# Patient Record
Sex: Female | Born: 1965
Health system: Southern US, Community
[De-identification: ages and names within clinical notes are randomized; demographics above are authoritative.]

## PROBLEM LIST (undated history)

## (undated) DIAGNOSIS — N809 Endometriosis, unspecified: Secondary | ICD-10-CM

## (undated) DIAGNOSIS — E739 Lactose intolerance, unspecified: Secondary | ICD-10-CM

## (undated) DIAGNOSIS — R87619 Unspecified abnormal cytological findings in specimens from cervix uteri: Secondary | ICD-10-CM

## (undated) DIAGNOSIS — E785 Hyperlipidemia, unspecified: Secondary | ICD-10-CM

## (undated) HISTORY — DX: Hyperlipidemia, unspecified: E78.5

## (undated) HISTORY — DX: Unspecified abnormal cytological findings in specimens from cervix uteri: R87.619

## (undated) HISTORY — DX: Lactose intolerance, unspecified: E73.9

## (undated) HISTORY — DX: Endometriosis, unspecified: N80.9

---

## 1992-08-13 HISTORY — PX: REFRACTIVE SURGERY: SHX103

## 2002-08-13 HISTORY — PX: ABDOMINAL HYSTERECTOMY: SHX81

## 2005-03-02 ENCOUNTER — Emergency Department: Payer: Self-pay | Admitting: Emergency Medicine

## 2005-05-22 ENCOUNTER — Ambulatory Visit: Payer: Self-pay | Admitting: Obstetrics and Gynecology

## 2005-07-03 ENCOUNTER — Ambulatory Visit: Payer: Self-pay | Admitting: Obstetrics and Gynecology

## 2006-05-28 ENCOUNTER — Ambulatory Visit: Payer: Self-pay | Admitting: Obstetrics and Gynecology

## 2006-08-13 HISTORY — PX: REDUCTION MAMMAPLASTY: SUR839

## 2007-06-03 ENCOUNTER — Ambulatory Visit: Payer: Self-pay | Admitting: Obstetrics and Gynecology

## 2007-12-18 ENCOUNTER — Emergency Department: Payer: Self-pay | Admitting: Emergency Medicine

## 2008-06-08 ENCOUNTER — Ambulatory Visit: Payer: Self-pay | Admitting: Obstetrics and Gynecology

## 2010-07-18 ENCOUNTER — Ambulatory Visit: Payer: Self-pay | Admitting: Family Medicine

## 2011-09-13 ENCOUNTER — Ambulatory Visit: Payer: Self-pay | Admitting: Family Medicine

## 2012-08-14 ENCOUNTER — Ambulatory Visit: Payer: Self-pay | Admitting: Family Medicine

## 2013-08-27 ENCOUNTER — Ambulatory Visit: Payer: Self-pay | Admitting: Family Medicine

## 2015-11-28 ENCOUNTER — Encounter: Payer: Self-pay | Admitting: *Deleted

## 2015-12-14 ENCOUNTER — Encounter: Payer: Self-pay | Admitting: General Surgery

## 2015-12-14 ENCOUNTER — Ambulatory Visit (INDEPENDENT_AMBULATORY_CARE_PROVIDER_SITE_OTHER): Payer: Managed Care, Other (non HMO) | Admitting: General Surgery

## 2015-12-14 VITALS — BP 132/64 | HR 76 | Resp 12 | Ht 66.0 in | Wt 160.0 lb

## 2015-12-14 DIAGNOSIS — Z1211 Encounter for screening for malignant neoplasm of colon: Secondary | ICD-10-CM

## 2015-12-14 MED ORDER — POLYETHYLENE GLYCOL 3350 17 GM/SCOOP PO POWD: ORAL | Status: DC

## 2015-12-14 NOTE — Patient Instructions (Addendum)
Colonoscopy A colonoscopy is an exam to look at the entire large intestine (colon). This exam can help find problems such as tumors, polyps, inflammation, and areas of bleeding. The exam takes about 1 hour.  LET YOUR HEALTH CARE PROVIDER KNOW ABOUT:   Any allergies you have.  All medicines you are taking, including vitamins, herbs, eye drops, creams, and over-the-counter medicines.  Previous problems you or members of your family have had with the use of anesthetics.  Any blood disorders you have.  Previous surgeries you have had.  Medical conditions you have. RISKS AND COMPLICATIONS  Generally, this is a safe procedure. However, as with any procedure, complications can occur. Possible complications include:  Bleeding.  Tearing or rupture of the colon wall.  Reaction to medicines given during the exam.  Infection (rare). BEFORE THE PROCEDURE   Ask your health care provider about changing or stopping your regular medicines.  You may be prescribed an oral bowel prep. This involves drinking a large amount of medicated liquid, starting the day before your procedure. The liquid will cause you to have multiple loose stools until your stool is almost clear or light green. This cleans out your colon in preparation for the procedure.  Do not eat or drink anything else once you have started the bowel prep, unless your health care provider tells you it is safe to do so.  Arrange for someone to drive you home after the procedure. PROCEDURE   You will be given medicine to help you relax (sedative).  You will lie on your side with your knees bent.  A long, flexible tube with a light and camera on the end (colonoscope) will be inserted through the rectum and into the colon. The camera sends video back to a computer screen as it moves through the colon. The colonoscope also releases carbon dioxide gas to inflate the colon. This helps your health care provider see the area better.  During  the exam, your health care provider may take a small tissue sample (biopsy) to be examined under a microscope if any abnormalities are found.  The exam is finished when the entire colon has been viewed. AFTER THE PROCEDURE   Do not drive for 24 hours after the exam.  You may have a small amount of blood in your stool.  You may pass moderate amounts of gas and have mild abdominal cramping or bloating. This is caused by the gas used to inflate your colon during the exam.  Ask when your test results will be ready and how you will get your results. Make sure you get your test results.   This information is not intended to replace advice given to you by your health care provider. Make sure you discuss any questions you have with your health care provider.   Document Released: 07/27/2000 Document Revised: 05/20/2013 Document Reviewed: 04/06/2013 Elsevier Interactive Patient Education 2016 [employer]  Patient has been scheduled for a colonoscopy on 01-18-16 at [facility]. This patient has been asked to discontinue fish oil one week prior to procedure. 

## 2015-12-14 NOTE — Progress Notes (Signed)
Patient ID: Jobe Igo, female   DOB: 21-Jun-1966, 50 y.o.   MRN: [id number]  Chief Complaint  Patient presents with  . Other    Colonoscopy    HPI Megan Hayes is a 50 y.o. female here today for an evaluation for a colonoscopy. None prior. Patient reports she has occasional diarrhea and some dark color stools. She states she has some lower left abdominal pain before she moves her bowels.  I have reviewed the history of present illness with the patient.  HPI  History reviewed. No pertinent past medical history.  Past Surgical History  Procedure Laterality Date  . Eye surgery  1994  . Partial hysterectomy  2004  . Breast surgery  2009    breast reduction    Family History  Problem Relation Age of Onset  . Cancer Maternal Aunt 65    Colon  . Cancer Maternal Uncle     Liver  . Cancer Maternal Grandmother     Ovarian Cancer  . Cancer Maternal Aunt     Colon    Social History Social History  Substance Use Topics  . Smoking status: Never Smoker   . Smokeless tobacco: Never Used  . Alcohol Use: 0.6 - 1.2 oz/week    1-2 Standard drinks or equivalent per week    No Known Allergies  Current Outpatient Prescriptions  Medication Sig Dispense Refill  . cholecalciferol (VITAMIN D) 1000 units tablet Take 1,000 Units by mouth daily.    Marland Kitchen ibuprofen (ADVIL,MOTRIN) 200 MG tablet Take 200 mg by mouth every 6 (six) hours as needed.    . Omega-3 Fatty Acids (FISH OIL) 1000 MG CAPS Take by mouth.     No current facility-administered medications for this visit.    Review of Systems Review of Systems  Constitutional: Negative.   Respiratory: Negative.   Cardiovascular: Negative.     Blood pressure 132/64, pulse 76, resp. rate 12, height  (1.676 m), weight 160 lb (72.576 kg).  Physical Exam Physical Exam  Constitutional: She is oriented to person, place, and time. Vital signs are normal. She appears well-developed and well-nourished.  Eyes:  Conjunctivae are normal. No scleral icterus.  Neck: Neck supple. No thyromegaly present.  Cardiovascular: Normal rate, regular rhythm and normal heart sounds.   Pulmonary/Chest: Effort normal and breath sounds normal.  Abdominal: Soft. Normal appearance. She exhibits no distension. There is no tenderness.  Lymphadenopathy:    She has no cervical adenopathy.  Neurological: She is alert and oriented to person, place, and time.  Skin: Skin is warm and dry.    Data Reviewed Prior notes  Assessment    Stable exam. Recommend screening colonoscopy based on age. Remote family history of colon cancer.    Plan    Colonoscopy with possible biopsy/polypectomy prn: Information regarding the procedure, including its potential risks and complications (including but not limited to perforation of the bowel, which may require emergency surgery to repair, and bleeding) was verbally given to the patient. Educational information regarding lower intestinal endoscopy was given to the patient. Written instructions for how to complete the bowel prep using Miralax were provided. The importance of drinking ample fluids to avoid dehydration as a result of the prep emphasized.    Patient has been scheduled for a colonoscopy on 01-18-16 at [facility]. This patient has been asked to discontinue fish oil one week prior to procedure.   This information has been scribed by Milas Kocher, CMA  PCP:No PCP Ref: Dr. Rayvon Charosenow   [name] G 12/14/2015, 3:04 PM

## 2016-01-18 ENCOUNTER — Ambulatory Visit: Payer: Managed Care, Other (non HMO) | Admitting: Anesthesiology

## 2016-01-18 ENCOUNTER — Ambulatory Visit
Admission: RE | Admit: 2016-01-18 | Discharge: 2016-01-18 | Disposition: A | Discharge status: Home Health | Payer: Managed Care, Other (non HMO) | Source: Ambulatory Visit | Attending: General Surgery | Admitting: General Surgery

## 2016-01-18 ENCOUNTER — Encounter
Admission: RE | Disposition: A | Discharge status: Home Health | Payer: Self-pay | Source: Ambulatory Visit | Attending: General Surgery

## 2016-01-18 DIAGNOSIS — Z1211 Encounter for screening for malignant neoplasm of colon: Secondary | ICD-10-CM | POA: Insufficient documentation

## 2016-01-18 DIAGNOSIS — Z8041 Family history of malignant neoplasm of ovary: Secondary | ICD-10-CM | POA: Diagnosis not present

## 2016-01-18 DIAGNOSIS — Z800 Family history of malignant neoplasm of digestive organs: Secondary | ICD-10-CM | POA: Insufficient documentation

## 2016-01-18 DIAGNOSIS — Z90710 Acquired absence of both cervix and uterus: Secondary | ICD-10-CM | POA: Insufficient documentation

## 2016-01-18 HISTORY — PX: COLONOSCOPY WITH PROPOFOL: SHX5780

## 2016-01-18 SURGERY — COLONOSCOPY WITH PROPOFOL
Anesthesia: General

## 2016-01-18 MED ORDER — MIDAZOLAM HCL 5 MG/5ML IJ SOLN
INTRAMUSCULAR | Status: DC | PRN
  Administered 2016-01-18: 1 mg via INTRAVENOUS

## 2016-01-18 MED ORDER — PROPOFOL 500 MG/50ML IV EMUL
INTRAVENOUS | Status: DC | PRN
  Administered 2016-01-18: 180 ug/kg/min via INTRAVENOUS

## 2016-01-18 MED ORDER — LIDOCAINE 2% (20 MG/ML) 5 ML SYRINGE
INTRAMUSCULAR | Status: DC | PRN
  Administered 2016-01-18: 40 mg via INTRAVENOUS

## 2016-01-18 MED ORDER — PHENYLEPHRINE HCL 10 MG/ML IJ SOLN
INTRAMUSCULAR | Status: DC | PRN
  Administered 2016-01-18 (×3): 100 ug via INTRAVENOUS

## 2016-01-18 MED ORDER — PROPOFOL 10 MG/ML IV BOLUS
INTRAVENOUS | Status: DC | PRN
  Administered 2016-01-18 (×2): 50 mg via INTRAVENOUS

## 2016-01-18 MED ORDER — FENTANYL CITRATE (PF) 100 MCG/2ML IJ SOLN
INTRAMUSCULAR | Status: DC | PRN
  Administered 2016-01-18: 50 ug via INTRAVENOUS

## 2016-01-18 MED ORDER — SODIUM CHLORIDE 0.9 % IV SOLN
INTRAVENOUS | Status: DC
  Administered 2016-01-18: 1000 mL via INTRAVENOUS
  Administered 2016-01-18: 11:00:00 via INTRAVENOUS

## 2016-01-18 NOTE — Anesthesia Preprocedure Evaluation (Signed)
Anesthesia Evaluation  Patient identified by MRN, date of birth, ID band Patient awake    Reviewed: Allergy & Precautions, H&P , NPO status , Patient's Chart, lab work & pertinent test results, reviewed documented beta blocker date and time   Airway Mallampati: I  TM Distance: >3 FB Neck ROM: full    Dental  (+) Caps, Teeth Intact   Pulmonary neg pulmonary ROS,    Pulmonary exam normal breath sounds clear to auscultation       Cardiovascular Exercise Tolerance: Good negative cardio ROS Normal cardiovascular exam Rhythm:regular Rate:Normal     Neuro/Psych negative neurological ROS  negative psych ROS   GI/Hepatic negative GI ROS, Neg liver ROS,   Endo/Other  negative endocrine ROS  Renal/GU negative Renal ROS  negative genitourinary   Musculoskeletal   Abdominal   Peds  Hematology negative hematology ROS (+)   Anesthesia Other Findings History reviewed. No pertinent past medical history.   Reproductive/Obstetrics negative OB ROS                             Anesthesia Physical Anesthesia Plan  ASA: I  Anesthesia Plan: General   Post-op Pain Management:    Induction:   Airway Management Planned:   Additional Equipment:   Intra-op Plan:   Post-operative Plan:   Informed Consent: I have reviewed the patients History and Physical, chart, labs and discussed the procedure including the risks, benefits and alternatives for the proposed anesthesia with the patient or authorized representative who has indicated his/her understanding and acceptance.   Dental Advisory Given  Plan Discussed with: Anesthesiologist, CRNA and Surgeon  Anesthesia Plan Comments:         Anesthesia Quick Evaluation

## 2016-01-18 NOTE — H&P (Signed)
Megan Hayes is an 50 y.o. female.   Chief Complaint: here for colonoscopy HPI: Healthy 50 yr old female with no major GI symptoms. Needs screening colonoscopy.  History reviewed. No pertinent past medical history.  Past Surgical History  Procedure Laterality Date  . Eye surgery  1994  . Partial hysterectomy  2004  . Breast surgery  2009    breast reduction    Family History  Problem Relation Age of Onset  . Cancer Maternal Aunt 65    Colon  . Cancer Maternal Uncle     Liver  . Cancer Maternal Grandmother     Ovarian Cancer  . Cancer Maternal Aunt     Colon   Social History:  reports that she has never smoked. She has never used smokeless tobacco. She reports that she drinks about 0.6 - 1.2 oz of alcohol per week. She reports that she does not use illicit drugs.  Allergies: No Known Allergies  Medications Prior to Admission  Medication Sig Dispense Refill  . cholecalciferol (VITAMIN D) 1000 units tablet Take 1,000 Units by mouth daily.    Marland Kitchen. ibuprofen (ADVIL,MOTRIN) 200 MG tablet Take 200 mg by mouth every 6 (six) hours as needed.    . Omega-3 Fatty Acids (FISH OIL) 1000 MG CAPS Take by mouth.    . polyethylene glycol powder (GLYCOLAX/MIRALAX) powder 255 grams one bottle for colonoscopy prep 255 g 0    No results found for this or any previous visit (from the past 48 hour(s)). No results found.  Review of Systems  Constitutional: Negative.   Respiratory: Negative.   Cardiovascular: Negative.   Gastrointestinal: Positive for abdominal pain (occasional, llq). Negative for nausea, vomiting, diarrhea and constipation.  Genitourinary: Negative.     Blood pressure 110/79, pulse 78, temperature 98.5 F (36.9 C), temperature source Oral, resp. rate 17, height 5\' 6"  (1.676 m), weight 160 lb (72.576 kg), SpO2 99 %. Physical Exam  Constitutional: She is oriented to person, place, and time. She appears well-developed and well-nourished.  Eyes: Conjunctivae are  normal. No scleral icterus.  Neck: Neck supple.  Cardiovascular: Normal rate, regular rhythm and normal heart sounds.   Respiratory: Effort normal and breath sounds normal.  GI: Soft. Bowel sounds are normal. She exhibits no distension and no mass. There is no tenderness.  Lymphadenopathy:    She has no cervical adenopathy.  Neurological: She is alert and oriented to person, place, and time.  Skin: Skin is warm and dry.     Assessment/Plan Proceed with colonoscopy as scheduled  Kieth BrightlySANKAR,Advay Volante G, MD 01/18/2016, 11:34 AM

## 2016-01-18 NOTE — Op Note (Signed)
[facility] Patient Name: Megan Hayes Procedure Date: 01/18/2016 11:41 AM MRN: [id number] Account #: [id number] Date of Birth: November 23, 1965 Admit Type: Outpatient Age: 50 Room: [facility] ENDO ROOM 1 Gender: Female Note Status: Finalized Procedure:            Colonoscopy Indications:          Screening for colorectal malignant neoplasm Providers:            Adana Marik G. Evette Cristal, MD Referring MD:         Sallye Lat Md, MD (Referring MD) Medicines:            General Anesthesia Complications:        No immediate complications. Procedure:            Pre-Anesthesia Assessment:                       - General anesthesia under the supervision of an                        anesthesiologist was determined to be medically                        necessary for this procedure based on review of the                        patient's medical history, medications, and prior                        anesthesia history.                       After obtaining informed consent, the colonoscope was                        passed under direct vision. Throughout the procedure,                        the patient's blood pressure, pulse, and oxygen                        saturations were monitored continuously. The                        Colonoscope was introduced through the anus and                        advanced to the the cecum, identified by the ileocecal                        valve. The patient tolerated the procedure well. The                        quality of the bowel preparation was good. Findings:      The perianal and digital rectal examinations were normal.      The entire examined colon appeared normal on direct and retroflexion       views. Impression:           - The entire examined colon is normal on direct and  retroflexion views.                       - No specimens collected. Recommendation:       - Discharge patient to  home. Procedure Code(s):    --- Professional ---                       [id number], Colonoscopy, flexible; diagnostic, including                        collection of specimen(s) by brushing or washing, when                        performed (separate procedure) Diagnosis Code(s):    --- Professional ---                       Z12.11, Encounter for screening for malignant neoplasm                        of colon CPT copyright 2016 American [employer]. All rights reserved. The codes documented in this report are preliminary and upon coder review may  be revised to meet current compliance requirements. Kieth BrightlySeeplaputhur G Aya Geisel, MD 01/18/2016 12:23:45 PM This report has been signed electronically. Number of Addenda: 0 Note Initiated On: 01/18/2016 11:41 AM Scope Withdrawal Time: 0 hours 8 minutes 19 seconds  Total Procedure Duration: 0 hours 24 minutes 34 seconds       [facility]

## 2016-01-18 NOTE — Transfer of Care (Signed)
Immediate Anesthesia Transfer of Care Note  Patient: Megan Hayes  Procedure(s) Performed: Procedure(s): COLONOSCOPY WITH PROPOFOL (N/A)  Patient Location: PACU and Endoscopy Unit  Anesthesia Type:General  Level of Consciousness: awake and patient cooperative  Airway & Oxygen Therapy: Patient Spontanous Breathing and Patient connected to nasal cannula oxygen  Post-op Assessment: Report given to RN and Post -op Vital signs reviewed and stable  Post vital signs: Reviewed and stable  Last Vitals:  Filed Vitals:   01/18/16 1046  BP: 110/79  Pulse: 78  Temp: 36.9 C  Resp: 17    Last Pain: There were no vitals filed for this visit.       Complications: No apparent anesthesia complications

## 2016-01-18 NOTE — Anesthesia Postprocedure Evaluation (Signed)
Anesthesia Post Note  Patient: Megan Hayes  Procedure(s) Performed: Procedure(s) (LRB): COLONOSCOPY WITH PROPOFOL (N/A)  Patient location during evaluation: Endoscopy Anesthesia Type: General Level of consciousness: awake and alert Pain management: pain level controlled Vital Signs Assessment: post-procedure vital signs reviewed and stable Respiratory status: spontaneous breathing, nonlabored ventilation, respiratory function stable and patient connected to nasal cannula oxygen Cardiovascular status: blood pressure returned to baseline and stable Postop Assessment: no signs of nausea or vomiting Anesthetic complications: no    Last Vitals:  Filed Vitals:   01/18/16 1248 01/18/16 1258  BP: 115/78 112/95  Pulse: 68 70  Temp:    Resp: 19 17    Last Pain: There were no vitals filed for this visit.               Lenard SimmerAndrew Woodford Strege

## 2016-01-19 ENCOUNTER — Encounter: Payer: Self-pay | Admitting: General Surgery

## 2016-05-13 HISTORY — PX: TENOSYNOVECTOMY: SHX6110

## 2016-08-14 ENCOUNTER — Encounter (HOSPITAL_COMMUNITY): Payer: Self-pay | Admitting: *Deleted

## 2016-08-15 NOTE — H&P (Signed)
  Shadara L Tabares Ross LudwigDe Cardenas is an 51 y.o. female.   Chief Complaint: RIGHT WRIST RADIAL STYLOID TENOSYNOVITIS HPI: PATIENT IS BEING FOLLOWED FOR RIGHT WRIST RADIAL STYLOID TENOSYNOVITIS.  PREVIOUS SURGERY FOR LEFT WRIST RADIAL STYLOID TENOSYNOVITIS ON 05/21/16.  PATIENT HAS FAILED CONSERVATIVE MANAGEMENT FOR THE RIGHT WRIST AND WOULD LIKE TO PROCEED WITH SURGERY.  THE PATIENT IS HERE TODAY FOR SURGERY.  No past medical history on file.  Past Surgical History:  Procedure Laterality Date  . BREAST SURGERY  2009   breast reduction  . COLONOSCOPY WITH PROPOFOL N/A 01/18/2016   Procedure: COLONOSCOPY WITH PROPOFOL;  Surgeon: Kieth BrightlySeeplaputhur G Sankar, MD;  Location: [facility] ENDOSCOPY;  Service: Endoscopy;  Laterality: N/A;  . EYE SURGERY  1994  . PARTIAL HYSTERECTOMY  2004    Family History  Problem Relation Age of Onset  . Cancer Maternal Aunt 65    Colon  . Cancer Maternal Uncle     Liver  . Cancer Maternal Grandmother     Ovarian Cancer  . Cancer Maternal Aunt     Colon   Social History:  reports that she has never smoked. She has never used smokeless tobacco. She reports that she drinks about 0.6 - 1.2 oz of alcohol per week . She reports that she does not use drugs.  Allergies: No Known Allergies  No prescriptions prior to admission.    No results found for this or any previous visit (from the past 48 hour(s)). No results found.  ROS NO RECENT ILLNESSES OR HOSPITALIZATIONS  There were no vitals taken for this visit. Physical Exam  General Appearance:  Alert, cooperative, no distress, appears stated age  Head:  Normocephalic, without obvious abnormality, atraumatic  Eyes:  Pupils equal, conjunctiva/corneas clear,         Throat: Lips, mucosa, and tongue normal; teeth and gums normal  Neck: No visible masses     Lungs:   respirations unlabored  Chest Wall:  No tenderness or deformity  Heart:  Regular rate and rhythm,  Abdomen:   Soft, non-tender,         Extremities:  RIGHT UE: TENDER TO PALPATION ALONG THE FIRST DORSAL COMPARTMENT. POSITIVE FINKELSTEIN'S TEST. NEUROMOTOR AND SENSORY FUNCTION IS NORMAL TO THE WRIST, HAND, AND DIGITS  Pulses: 2+ and symmetric  Skin: Skin color, texture, turgor normal, no rashes or lesions     Neurologic: Normal    Assessment RIGHT WRIST RADIAL STYLOID TENOSYNOVITIS  Plan RIGHT WRIST FIRST DORSAL COMPARTMENT TENOSYNOVECTOMY  R/B/A DISCUSSED WITH PT IN OFFICE.  PT VOICED UNDERSTANDING OF PLAN CONSENT SIGNED DAY OF SURGERY PT SEEN AND EXAMINED PRIOR TO OPERATIVE PROCEDURE/DAY OF SURGERY SITE MARKED. QUESTIONS ANSWERED WILL GO HOME FOLLOWING SURGERY  WE ARE PLANNING SURGERY FOR YOUR UPPER EXTREMITY. THE RISKS AND BENEFITS OF SURGERY INCLUDE BUT NOT LIMITED TO BLEEDING INFECTION, DAMAGE TO NEARBY NERVES ARTERIES TENDONS, FAILURE OF SURGERY TO ACCOMPLISH ITS INTENDED GOALS, PERSISTENT SYMPTOMS AND NEED FOR FURTHER SURGICAL INTERVENTION. WITH THIS IN MIND WE WILL PROCEED. I HAVE DISCUSSED WITH THE PATIENT THE PRE AND POSTOPERATIVE REGIMEN AND THE DOS AND DON'TS. PT VOICED UNDERSTANDING AND INFORMED CONSENT SIGNED.  Karma GreaserSamantha Bonham Barton 08/15/2016, 1:25 PM

## 2016-08-18 ENCOUNTER — Ambulatory Visit (HOSPITAL_COMMUNITY): Payer: Worker's Compensation | Admitting: Anesthesiology

## 2016-08-18 ENCOUNTER — Encounter (HOSPITAL_COMMUNITY)
Admission: RE | Disposition: A | Discharge status: Home / Self Care | Payer: Self-pay | Source: Ambulatory Visit | Attending: Orthopedic Surgery

## 2016-08-18 ENCOUNTER — Ambulatory Visit (HOSPITAL_COMMUNITY)
Admission: RE | Admit: 2016-08-18 | Discharge: 2016-08-18 | Disposition: A | Discharge status: Home / Self Care | Payer: Worker's Compensation | Source: Ambulatory Visit | Attending: Orthopedic Surgery | Admitting: Orthopedic Surgery

## 2016-08-18 ENCOUNTER — Encounter (HOSPITAL_COMMUNITY): Payer: Self-pay | Admitting: *Deleted

## 2016-08-18 ENCOUNTER — Ambulatory Visit: Admit: 2016-08-18 | Payer: Managed Care, Other (non HMO) | Admitting: Orthopedic Surgery

## 2016-08-18 DIAGNOSIS — M654 Radial styloid tenosynovitis [de Quervain]: Secondary | ICD-10-CM | POA: Diagnosis not present

## 2016-08-18 HISTORY — PX: TENOSYNOVECTOMY: SHX6110

## 2016-08-18 SURGERY — TENOSYNOVECTOMY
Anesthesia: Monitor Anesthesia Care | Site: Wrist | Laterality: Right

## 2016-08-18 SURGERY — TENOSYNOVECTOMY
Anesthesia: Monitor Anesthesia Care | Laterality: Right

## 2016-08-18 MED ORDER — ONDANSETRON HCL 4 MG/2ML IJ SOLN
INTRAMUSCULAR | Status: AC
  Filled 2016-08-18: qty 2

## 2016-08-18 MED ORDER — BUPIVACAINE HCL (PF) 0.5 % IJ SOLN
INTRAMUSCULAR | Status: AC
  Filled 2016-08-18: qty 30

## 2016-08-18 MED ORDER — CEFAZOLIN SODIUM-DEXTROSE 2-4 GM/100ML-% IV SOLN
INTRAVENOUS | Status: AC
  Filled 2016-08-18: qty 100

## 2016-08-18 MED ORDER — LACTATED RINGERS IV SOLN: INTRAVENOUS | Status: DC

## 2016-08-18 MED ORDER — LIDOCAINE HCL (PF) 1 % IJ SOLN
INTRAMUSCULAR | Status: AC
  Filled 2016-08-18: qty 30

## 2016-08-18 MED ORDER — CEFAZOLIN SODIUM-DEXTROSE 2-4 GM/100ML-% IV SOLN
2.0000 g | INTRAVENOUS | Status: AC
  Administered 2016-08-18: 2 g via INTRAVENOUS

## 2016-08-18 MED ORDER — PROPOFOL 10 MG/ML IV BOLUS
INTRAVENOUS | Status: DC | PRN
  Administered 2016-08-18: 10 mg via INTRAVENOUS
  Administered 2016-08-18: 20 mg via INTRAVENOUS
  Administered 2016-08-18 (×3): 10 mg via INTRAVENOUS
  Administered 2016-08-18: 20 mg via INTRAVENOUS
  Administered 2016-08-18 (×5): 10 mg via INTRAVENOUS
  Administered 2016-08-18: 20 mg via INTRAVENOUS
  Administered 2016-08-18: 10 mg via INTRAVENOUS

## 2016-08-18 MED ORDER — ONDANSETRON HCL 4 MG/2ML IJ SOLN
INTRAMUSCULAR | Status: DC | PRN
  Administered 2016-08-18: 4 mg via INTRAVENOUS

## 2016-08-18 MED ORDER — LIDOCAINE HCL 1 % IJ SOLN
INTRAMUSCULAR | Status: DC | PRN
  Administered 2016-08-18: 15 mL

## 2016-08-18 MED ORDER — LIDOCAINE 2% (20 MG/ML) 5 ML SYRINGE
INTRAMUSCULAR | Status: AC
  Filled 2016-08-18: qty 5

## 2016-08-18 MED ORDER — BUPIVACAINE HCL (PF) 0.25 % IJ SOLN
INTRAMUSCULAR | Status: AC
  Filled 2016-08-18: qty 30

## 2016-08-18 MED ORDER — HYDROMORPHONE HCL 1 MG/ML IJ SOLN
0.2500 mg | INTRAMUSCULAR | Status: DC | PRN
  Administered 2016-08-18: 0.5 mg via INTRAVENOUS

## 2016-08-18 MED ORDER — PROPOFOL 10 MG/ML IV BOLUS
INTRAVENOUS | Status: AC
  Filled 2016-08-18: qty 20

## 2016-08-18 MED ORDER — HYDROMORPHONE HCL 1 MG/ML IJ SOLN
INTRAMUSCULAR | Status: AC
  Filled 2016-08-18: qty 0.5

## 2016-08-18 MED ORDER — LIDOCAINE 2% (20 MG/ML) 5 ML SYRINGE
INTRAMUSCULAR | Status: DC | PRN
  Administered 2016-08-18: 40 mg via INTRAVENOUS

## 2016-08-18 MED ORDER — LACTATED RINGERS IV SOLN
INTRAVENOUS | Status: DC | PRN
  Administered 2016-08-18: 11:00:00 via INTRAVENOUS

## 2016-08-18 MED ORDER — 0.9 % SODIUM CHLORIDE (POUR BTL) OPTIME
TOPICAL | Status: DC | PRN
  Administered 2016-08-18: 1000 mL

## 2016-08-18 MED ORDER — CHLORHEXIDINE GLUCONATE 4 % EX LIQD: 60.0000 mL | Freq: Once | CUTANEOUS | Status: DC

## 2016-08-18 MED ORDER — BUPIVACAINE HCL (PF) 0.5 % IJ SOLN
INTRAMUSCULAR | Status: DC | PRN
  Administered 2016-08-18: 15 mL

## 2016-08-18 SURGICAL SUPPLY — 10 items
BANDAGE ACE 3X5.8 VEL STRL LF (GAUZE/BANDAGES/DRESSINGS) ×2 IMPLANT
BNDG ELASTIC 2X5.8 VLCR STR LF (GAUZE/BANDAGES/DRESSINGS) ×2 IMPLANT
CUFF TOURNIQUET SINGLE 18IN (TOURNIQUET CUFF) ×2 IMPLANT
GAUZE SPONGE 4X4 12PLY STRL (GAUZE/BANDAGES/DRESSINGS) ×2 IMPLANT
PACK DISP ORTHO I MAJOR (PACKS) ×2 IMPLANT
PADDING CAST ABS 3INX4YD NS (CAST SUPPLIES) ×1
PADDING CAST ABS COTTON 3X4 (CAST SUPPLIES) ×1 IMPLANT
STRIP CLOSURE SKIN 1/2X4 (GAUZE/BANDAGES/DRESSINGS) ×2 IMPLANT
SUT MNCRL AB 4-0 PS2 18 (SUTURE) ×2 IMPLANT
SUT PROLENE 4 0 PS 2 18 (SUTURE) ×2 IMPLANT

## 2016-08-18 NOTE — Anesthesia Preprocedure Evaluation (Addendum)
Anesthesia Evaluation  Patient identified by MRN, date of birth, ID band Patient awake    Reviewed: Allergy & Precautions, H&P , NPO status , Patient's Chart, lab work & pertinent test results  Airway Mallampati: II  TM Distance: >3 FB Neck ROM: Full    Dental no notable dental hx. (+) Teeth Intact, Dental Advisory Given   Pulmonary neg pulmonary ROS,    Pulmonary exam normal breath sounds clear to auscultation       Cardiovascular negative cardio ROS   Rhythm:Regular Rate:Normal     Neuro/Psych negative neurological ROS  negative psych ROS   GI/Hepatic negative GI ROS, Neg liver ROS,   Endo/Other  negative endocrine ROS  Renal/GU negative Renal ROS  negative genitourinary   Musculoskeletal   Abdominal   Peds  Hematology negative hematology ROS (+)   Anesthesia Other Findings   Reproductive/Obstetrics negative OB ROS                            Anesthesia Physical Anesthesia Plan  ASA: I  Anesthesia Plan: MAC   Post-op Pain Management:    Induction: Intravenous  Airway Management Planned: Simple Face Mask  Additional Equipment:   Intra-op Plan:   Post-operative Plan:   Informed Consent: I have reviewed the patients History and Physical, chart, labs and discussed the procedure including the risks, benefits and alternatives for the proposed anesthesia with the patient or authorized representative who has indicated his/her understanding and acceptance.   Dental advisory given  Plan Discussed with: CRNA  Anesthesia Plan Comments:         Anesthesia Quick Evaluation

## 2016-08-18 NOTE — Discharge Instructions (Signed)
KEEP BANDAGE CLEAN AND DRY °CALL OFFICE FOR F/U APPT [phone] °DR Zared Knoth CELL [phone] °KEEP HAND ELEVATED ABOVE HEART °OK TO APPLY ICE TO OPERATIVE AREA °CONTACT OFFICE IF ANY WORSENING PAIN OR CONCERNS. °

## 2016-08-18 NOTE — Anesthesia Postprocedure Evaluation (Signed)
Anesthesia Post Note  Patient: Megan Hayes  Procedure(s) Performed: Procedure(s) (LRB): TENOSYNOVECTOMY (Right)  Patient location during evaluation: PACU Anesthesia Type: MAC Level of consciousness: awake and alert Pain management: pain level controlled Vital Signs Assessment: post-procedure vital signs reviewed and stable Respiratory status: spontaneous breathing, nonlabored ventilation and respiratory function stable Cardiovascular status: stable and blood pressure returned to baseline Anesthetic complications: no       Last Vitals:  Vitals:   08/18/16 1140 08/18/16 1156  BP: 127/75 119/81  Pulse: 65 70  Resp: 12 12  Temp:      Last Pain:  Vitals:   08/18/16 1140  TempSrc:   PainSc: 6                  Kayanna Mckillop,W. EDMOND

## 2016-08-18 NOTE — Transfer of Care (Signed)
Immediate Anesthesia Transfer of Care Note  Patient: Megan Hayes  Procedure(s) Performed: Procedure(s): TENOSYNOVECTOMY (Right)  Patient Location: PACU  Anesthesia Type:MAC  Level of Consciousness: awake  Airway & Oxygen Therapy: Patient Spontanous Breathing  Post-op Assessment: Report given to RN and Post -op Vital signs reviewed and stable  Post vital signs: Reviewed and stable  Last Vitals:  Vitals:   08/18/16 1126 08/18/16 1140  BP: 120/82 127/75  Pulse: 69 65  Resp: 12   Temp: (P) 36.6 C     Last Pain:  Vitals:   08/18/16 1140  TempSrc:   PainSc: (P) 6       Patients Stated Pain Goal: 3 (68/34/19 6222)  Complications: No apparent anesthesia complications

## 2016-08-19 ENCOUNTER — Encounter (HOSPITAL_COMMUNITY): Payer: Self-pay | Admitting: Orthopedic Surgery

## 2016-08-20 NOTE — Op Note (Signed)
NAME:  Megan Hayes, Megan Hayes    ACCOUNT NO.:  [id number]  MEDICAL RECORD NO.:  [id number]  LOCATION:                                FACILITY:  [facility]  PHYSICIAN:  Sharma CovertFred W. Argenis Kumari IV, M.D.DATE OF BIRTH:  [birth year]  DATE OF PROCEDURE:  08/18/2016 DATE OF DISCHARGE:  08/18/2016                              OPERATIVE REPORT   PREOPERATIVE DIAGNOSIS:  Right wrist 1st dorsal compartment tenosynovitis.  POSTOPERATIVE DIAGNOSIS:  Right wrist 1st dorsal compartment tenosynovitis.  ATTENDING THIS ADMISSION:  Sharma CovertFred W. Satoya Feeley, M.D., who scrubbed and prepped for the entire procedure.  ASSISTANT SURGEON:  None.  ANESTHESIA:  1% Xylocaine, 0.25% Marcaine local block with IV sedation.  SURGICAL PROCEDURE:  Right wrist first dorsal compartment tenosynovectomy.  SURGICAL INDICATIONS:  [name] is a right-hand dominant female, who has had persistent right and left de Quervain's tenosynovitis.  The patient had done well on the left side and elected to undergo the similar procedure on the right.  Risks of surgery include but not limited to bleeding, infection, damage to nearby nerves, arteries, or tendons; loss of motion of wrist and digits, incomplete relief of symptoms, and need for further surgical intervention.  DESCRIPTION OF PROCEDURE:  The patient was properly identified in the preoperative holding area and marked with a permanent marker on the right wrist to indicate the correct operative site.  The patient was brought back to the operating room, placed supine on the anesthesia room table, and IV sedation was administered.  The local anesthetic was administered.  A well-padded tourniquet placed on right forearm, sealed with 1000 drape.  The right upper extremity was then prepped and draped in normal sterile fashion.  Time-out was called, correct side was identified, and procedure begun.  Attention then turned to the right wrist.  A longitudinal incision was made directly over the  first dorsal compartment.  After tourniquet inflated, dissection was carried down through the skin and subcutaneous tissue.  Careful dissection 1st on this radial sensory nerve was found, deep dissection carried and identified the first dorsal compartment and this was incised longitudinally.  The patient did have moderate inflammatory changes and tenosynovectomy was then carried out of the first dorsal compartment both proximally and distally.  The patient had multiple slips of the APL and did have a separate sheath to the EPB and these were then released. The wound was then thoroughly irrigated.  Course in the retinaculum had been excised dorsally and the wound was then irrigated.  After thorough wound irrigation, the subcutaneous tissues were then closed with Monocryl and the skin closed with running 4-0 Prolene.  Adaptic dressing and Steri-Strips were applied.  Sterile compressive bandage then applied.  The patient was placed in a well-padded thumb spica splint and taken to recovery room in good condition.  Postprocedure plan, The patient will be discharged home and will see her back in the office in approximately 2 weeks for wound check.  She should begin outpatient therapy regimen as she did for the contralateral side.     Madelynn DoneFred W. Owen Pagnotta IV, M.D.     FWO/MEDQ  D:  08/18/2016  T:  08/18/2016  Job:  [id number]

## 2016-08-20 NOTE — Brief Op Note (Signed)
Op note dicated Right first dorsal compartment tenosynovectomy

## 2016-10-02 ENCOUNTER — Other Ambulatory Visit: Payer: Self-pay | Admitting: Obstetrics & Gynecology

## 2016-10-02 DIAGNOSIS — Z1231 Encounter for screening mammogram for malignant neoplasm of breast: Secondary | ICD-10-CM

## 2016-10-22 ENCOUNTER — Ambulatory Visit
Admission: RE | Admit: 2016-10-22 | Discharge: 2016-10-22 | Disposition: A | Discharge status: Home / Self Care | Payer: Managed Care, Other (non HMO) | Source: Ambulatory Visit | Attending: Obstetrics & Gynecology | Admitting: Obstetrics & Gynecology

## 2016-10-22 DIAGNOSIS — Z1231 Encounter for screening mammogram for malignant neoplasm of breast: Secondary | ICD-10-CM | POA: Diagnosis not present

## 2016-10-23 ENCOUNTER — Encounter: Payer: Self-pay | Admitting: Obstetrics & Gynecology

## 2016-11-15 ENCOUNTER — Encounter: Payer: Self-pay | Admitting: Family Medicine

## 2016-11-15 ENCOUNTER — Encounter (INDEPENDENT_AMBULATORY_CARE_PROVIDER_SITE_OTHER): Payer: Self-pay

## 2016-11-15 ENCOUNTER — Ambulatory Visit (INDEPENDENT_AMBULATORY_CARE_PROVIDER_SITE_OTHER): Payer: 59 | Admitting: Family Medicine

## 2016-11-15 VITALS — BP 124/78 | HR 88 | Temp 98.5°F | Ht 66.0 in | Wt 169.2 lb

## 2016-11-15 DIAGNOSIS — J302 Other seasonal allergic rhinitis: Secondary | ICD-10-CM | POA: Diagnosis not present

## 2016-11-15 DIAGNOSIS — E781 Pure hyperglyceridemia: Secondary | ICD-10-CM | POA: Diagnosis not present

## 2016-11-15 DIAGNOSIS — J0100 Acute maxillary sinusitis, unspecified: Secondary | ICD-10-CM

## 2016-11-15 DIAGNOSIS — R87619 Unspecified abnormal cytological findings in specimens from cervix uteri: Secondary | ICD-10-CM | POA: Insufficient documentation

## 2016-11-15 DIAGNOSIS — E785 Hyperlipidemia, unspecified: Secondary | ICD-10-CM | POA: Insufficient documentation

## 2016-11-15 DIAGNOSIS — E739 Lactose intolerance, unspecified: Secondary | ICD-10-CM | POA: Diagnosis not present

## 2016-11-15 MED ORDER — FISH OIL 1000 MG PO CAPS: 2000.0000 mg | ORAL_CAPSULE | Freq: Every day | ORAL | Status: DC

## 2016-11-15 MED ORDER — FLUTICASONE PROPIONATE 50 MCG/ACT NA SUSP
2.0000 | Freq: Every day | NASAL | 6 refills | Status: DC
Start: 2016-11-15 — End: 2016-12-12

## 2016-11-15 MED ORDER — DOXYCYCLINE HYCLATE 100 MG PO TABS: 100.0000 mg | ORAL_TABLET | Freq: Two times a day (BID) | ORAL | 0 refills | Status: DC

## 2016-11-15 NOTE — Assessment & Plan Note (Signed)
Start flonase which has previously been effective.

## 2016-11-15 NOTE — Assessment & Plan Note (Signed)
Predominantly hypertriglyceridemia by report. Discussed diet changes to affect improved readings. Continue fish oil 2gm daily. RTC 3 mo FLP and OV.

## 2016-11-15 NOTE — Progress Notes (Signed)
Pre visit review using our clinic review tool, if applicable. No additional management support is needed unless otherwise documented below in the visit note. 

## 2016-11-15 NOTE — Assessment & Plan Note (Addendum)
Anticipate bacterial sinusitis given duration and progression of symptoms. Treat with doxy 10d corse (PCN intolerance) and flonase. Further supportive care reviewed.

## 2016-11-15 NOTE — Progress Notes (Signed)
BP 124/78   Pulse 88   Temp 98.5 F (36.9 C) (Oral)   Ht  (1.676 m)   Wt 169 lb 4 oz (76.8 kg)   BMI 27.32 kg/m    CC: new pt to establish care Subjective:    Patient ID: Megan Hayes, female    DOB: [birth year], 51 y.o.   MRN: [id number]  HPI: Megan Hayes is a 51 y.o. female presenting on 11/15/2016 for Establish Care   No recent PCP.   3 wk h/o sinus congestion with headache, R facial pain, ear pain. Some dyspnea at night time and cough. PNDrainage. No fevers/chills, ST. Treating with nyquil, tylenol sinus.  Ongoing muffled hearing. She uses q tips and ear plugs.  Daughter sick recently.  No smokers at home  No h/o asthma.   Seasonal allergies. Previously well controlled with flonase.   h/o hypertriglyceridemia 500s. She increased fish oil to 2gm daily.   s/p bilateral wrist surgeries for de quervain tenosynovitis.   Preventative: Well woman with Consuella Lose - h/o uterine cancer. Last 09/2016.  Colonoscopy 01/2016 WNL ([facility])  Lives with BF, 2 dogs 2 children  Occ: marketing  Edu: bachelor's  Activity: no regular exercise  Diet: good water, fruits/vegetables daily   Relevant past medical, surgical, family and social history reviewed and updated as indicated. Interim medical history since our last visit reviewed. Allergies and medications reviewed and updated. Outpatient Medications Prior to Visit  Medication Sig Dispense Refill  . cholecalciferol (VITAMIN D) 1000 units tablet Take 1,000 Units by mouth daily.    Marland Kitchen Phenylephrine-Acetaminophen (TYLENOL SINUS CONGESTION/PAIN PO) Take 1 tablet by mouth daily as needed (cold symptoms).    . vitamin C (ASCORBIC ACID) 500 MG tablet Take 500 mg by mouth daily.     No facility-administered medications prior to visit.      Per HPI unless specifically indicated in ROS section below Review of Systems     Objective:    BP 124/78   Pulse 88   Temp 98.5 F (36.9 C) (Oral)   Ht   (1.676 m)   Wt 169 lb 4 oz (76.8 kg)   BMI 27.32 kg/m   Wt Readings from Last 3 Encounters:  11/15/16 169 lb 4 oz (76.8 kg)  08/18/16 162 lb (73.5 kg)  01/18/16 160 lb (72.6 kg)    Physical Exam  Constitutional: She appears well-developed and well-nourished. No distress.  HENT:  Head: Normocephalic and atraumatic.  Right Ear: Hearing, external ear and ear canal normal.  Left Ear: Hearing, external ear and ear canal normal.  Nose: Mucosal edema present. No rhinorrhea. Right sinus exhibits maxillary sinus tenderness. Right sinus exhibits no frontal sinus tenderness. Left sinus exhibits maxillary sinus tenderness. Left sinus exhibits no frontal sinus tenderness.  Mouth/Throat: Uvula is midline, oropharynx is clear and moist and mucous membranes are normal. No oropharyngeal exudate, posterior oropharyngeal edema, posterior oropharyngeal erythema or tonsillar abscesses.  Bilateral TMs retracted  Eyes: Conjunctivae and EOM are normal. Pupils are equal, round, and reactive to light. No scleral icterus.  Neck: Normal range of motion. Neck supple.  Cardiovascular: Normal rate, regular rhythm, normal heart sounds and intact distal pulses.   No murmur heard. Pulmonary/Chest: Effort normal and breath sounds normal. No respiratory distress. She has no wheezes. She has no rales.  Lymphadenopathy:    She has no cervical adenopathy.  Skin: Skin is warm and dry. No rash noted.  Nursing note and vitals  reviewed.  No results found for this or any previous visit.    Assessment & Plan:   Problem List Items Addressed This Visit    Abnormal Pap smear of cervix    s/p hysterectomy, followed regularly by OBGYN.       Acute non-recurrent maxillary sinusitis - Primary    Anticipate bacterial sinusitis given duration and progression of symptoms. Treat with doxy 10d corse (PCN intolerance) and flonase. Further supportive care reviewed.       Relevant Medications   fluticasone (FLONASE) 50 MCG/ACT nasal  spray   doxycycline (VIBRA-TABS) 100 MG tablet   Hyperlipidemia    Predominantly hypertriglyceridemia by report. Discussed diet changes to affect improved readings. Continue fish oil 2gm daily. RTC 3 mo FLP and OV.       Relevant Orders   Lipid panel   Comprehensive metabolic panel   Lactose intolerance in adult   Seasonal allergic rhinitis    Start flonase which has previously been effective.           Follow up plan: Return in about 3 months (around 02/14/2017).  Eustaquio Boyden, MD

## 2016-11-15 NOTE — Assessment & Plan Note (Signed)
s/p hysterectomy, followed regularly by OBGYN.

## 2016-11-15 NOTE — Patient Instructions (Addendum)
creo que tiene infeccion de sinusitis.  Tome doxycycline por 10 dias.  tambien mandare flonase a la Corporate investment banker.  Para colesterol - menos azucares agregadas a la dieta, menos bebidas dulces, eliminar trans fats, mas fibra y limitar alcohol. todo estos cambios pueden bajar trigliceritos en 50%.  regresar en 3 meses para laboratorios - Barista.   Food Choices to Lower Your Triglycerides Triglycerides are a type of fat in your blood. High levels of triglycerides can increase the risk of heart disease and stroke. If your triglyceride levels are high, the foods you eat and your eating habits are very important. Choosing the right foods can help lower your triglycerides. What general guidelines do I need to follow?  Lose weight if you are overweight.  Limit or avoid alcohol.  Fill one half of your plate with vegetables and green salads.  Limit fruit to two servings a day. Choose fruit instead of juice.  Make one fourth of your plate whole grains. Look for the word "whole" as the first word in the ingredient list.  Fill one fourth of your plate with lean protein foods.  Enjoy fatty fish (such as salmon, mackerel, sardines, and tuna) three times a week.  Choose healthy fats.  Limit foods high in starch and sugar.  Eat more home-cooked food and less restaurant, buffet, and fast food.  Limit fried foods.  Cook foods using methods other than frying.  Limit saturated fats.  Check ingredient lists to avoid foods with partially hydrogenated oils (trans fats) in them. What foods can I eat? Grains  Whole grains, such as whole wheat or whole grain breads, crackers, cereals, and pasta. Unsweetened oatmeal, bulgur, barley, quinoa, or brown rice. Corn or whole wheat flour tortillas. Vegetables  Fresh or frozen vegetables (raw, steamed, roasted, or grilled). Green salads. Fruits  All fresh, canned (in natural juice), or frozen fruits. Meat and Other Protein Products  Ground beef (85%  or leaner), grass-fed beef, or beef trimmed of fat. Skinless chicken or [place]. Ground chicken or [place]. Pork trimmed of fat. All fish and seafood. Eggs. Dried beans, peas, or lentils. Unsalted nuts or seeds. Unsalted canned or dry beans. Dairy  Low-fat dairy products, such as skim or 1% milk, 2% or reduced-fat cheeses, low-fat ricotta or cottage cheese, or plain low-fat yogurt. Fats and Oils  Tub margarines without trans fats. Light or reduced-fat mayonnaise and salad dressings. Avocado. Safflower, olive, or canola oils. Natural peanut or almond butter. The items listed above may not be a complete list of recommended foods or beverages. Contact your dietitian for more options.  What foods are not recommended? Grains  White bread. White pasta. White rice. Cornbread. Bagels, pastries, and croissants. Crackers that contain trans fat. Vegetables  White potatoes. Corn. Creamed or fried vegetables. Vegetables in a cheese sauce. Fruits  Dried fruits. Canned fruit in light or heavy syrup. Fruit juice. Meat and Other Protein Products  Fatty cuts of meat. Ribs, chicken wings, bacon, sausage, bologna, salami, chitterlings, fatback, hot dogs, bratwurst, and packaged luncheon meats. Dairy  Whole or 2% milk, cream, half-and-half, and cream cheese. Whole-fat or sweetened yogurt. Full-fat cheeses. Nondairy creamers and whipped toppings. Processed cheese, cheese spreads, or cheese curds. Sweets and Desserts  Corn syrup, sugars, honey, and molasses. Candy. Jam and jelly. Syrup. Sweetened cereals. Cookies, pies, cakes, donuts, muffins, and ice cream. Fats and Oils  Butter, stick margarine, lard, shortening, ghee, or bacon fat. Coconut, palm kernel, or palm oils. Beverages  Alcohol. Sweetened drinks (such as  sodas, lemonade, and fruit drinks or punches). The items listed above may not be a complete list of foods and beverages to avoid. Contact your dietitian for more information.  This information is not  intended to replace advice given to you by your health care provider. Make sure you discuss any questions you have with your health care provider. Document Released: 05/17/2004 Document Revised: 01/05/2016 Document Reviewed: 06/03/2013 Elsevier Interactive Patient Education  2017 Elsevier Inc.  Sinusitis en adultos (Sinusitis, Adult) La sinusitis es la inflamacin y Chief Technology Officer en los senos paranasales. Los senos paranasales son espacios vacos en los huesos alrededor del rostro. Los senos paranasales se encuentran ubicados:  Alrededor de los ojos.  En la mitad de la frente.  Detrs de Architectural technologist.  En los pmulos. Los senos y las fosas nasales estn cubiertos de un lquido fibroso (mucosidad). Normalmente, la mucosidad drena a travs de los senos. Cuando los tejidos nasales se inflaman o hinchan, la mucosidad puede quedar atrapada o bloqueada, de modo que no puede fluir por los senos paranasales. Esto fomenta la proliferacin de bacterias, virus y hongos, lo que produce infecciones. La sinusitis puede desarrollarse rpidamente y durar entre 7 y 10das (aguda) o ms de 12das (crnica). A menudo, esta afeccin surge despus de un resfriado. CAUSAS Esta afeccin es causada por cualquier sustancia que inflame los senos o evite que la mucosidad drene, por ejemplo:  Alergias.  Asma.  Infecciones virales o bacterianas.  Huesos con forma Fiserv las fosas nasales.  Crecimientos nasales que contienen mucosidad (plipos nasales).  Aberturas sinusales estrechas.  Agentes contaminantes, como sustancias qumicas o irritantes presentes en el aire.  Un cuerpo extrao atorado [employer].  Infecciones por hongos. Esto es raro. FACTORES DE RIESGO Los siguientes factores pueden hacer que usted sea propenso a sufrir esta afeccin:  Archivist o asma.  Haber tenido una infeccin reciente en las vas respiratorias superiores o un resfriado.  Tener deformidades estructurales o  bloqueos en la nariz o los senos.  Tener un sistema inmunitario dbil.  Nadar o bucear mucho.  Abusar de los [employer].  Fumar. SNTOMAS Los principales sntomas de esta afeccin son dolor y sensacin de presin alrededor de los senos afectados. Otros sntomas pueden ser los siguientes:  Dolor en los dientes superiores.  Dolor de odos.  Dolor de [place].  Mal aliento.  Disminucin del sentido del olfato y del gusto.  Tos que empeora por la noche.  Fatiga.  Grant Ruts.  Mucosidad espesa que sale de la Clinical cytogeneticist. Generalmente, es de color verde y puede contener pus (purulento).  Nariz tapada o congestin nasal.  Goteo posnasal. Esto ocurre cuando se acumula mucosidad adicional en la garganta o la parte de atrs de la Clinical cytogeneticist.  Hinchazn y calor en los senos paranasales afectados.  Dolor de Advertising copywriter.  Sensibilidad a Statistician. DIAGNSTICO Esta enfermedad se diagnostica en funcin de los sntomas, los antecedentes mdicos y un examen fsico. Para descubrir si su afeccin es aguda o crnica, el mdico podra hacer lo siguiente:  Revisar su nariz en busca de plipos nasales.  Palpar los senos paranasales afectados para buscar signos de infeccin.  Observar la parte interna de los senos paranasales con un dispositivo que tiene una luz (endoscopio). Si el mdico sospecha que usted padece sinusitis crnica, podra indicarle lo siguiente:  Pruebas de alergias.  Una muestra de mucosidad de la nariz (cultivo nasal) para buscar bacterias.  Examen de [place] de mucosidad, para ver si la sinusitis se  relaciona con Crist Fat. Si la sinusitis no responde al tratamiento y dura ms de 8semanas, se le podra pedir una resonancia magntica o una tomografa computarizada para examinar los senos paranasales. Estos estudios tambin ayudan a Production assistant, radio gravedad de la infeccin. En contadas ocasiones, se puede ordenar una biopsia de hueso para descartar tipos ms graves de  infecciones por hongos en los senos paranasales. TRATAMIENTO El tratamiento para la sinusitis depende de la causa y de si la afeccin es [place]. Si lo que causa la sinusitis es un virus, los sntomas desparecern por s solos en el trmino de 10das. Podran recetarle medicamentos para [employer], entre los que se incluyen los siguientes:  Descongestivos nasales tpicos. Desinflaman las fosas nasales y permiten que la mucosidad drene por los senos paranasales.  Antihistamnicos. Este tipo de medicamento bloquea la inflamacin que ocasionan las [place]. Pueden ayudar a reducir la inflamacin en la nariz y los senos.  Corticoides nasales tpicos. Son aerosoles nasales que reducen la inflamacin e hinchazn en la Darene Lamer y los senos.  Lavados nasales con solucin salina. Estos enjuagues pueden ayudar a eliminar la mucosidad espesa en la nariz. Si la afeccin es causada por una bacteria, se le recetarn antibiticos. Si es causada por un hongo, se le recetarn antimicticos. Se podra necesitar ciruga para tratar enfermedades preexistentes, como las fosas nasales estrechas. Tambin podra ser necesaria para eliminar plipos. INSTRUCCIONES PARA EL CUIDADO EN EL HOGAR [employer], use o aplquese los medicamentos de venta libre y Building control surveyor como se lo haya indicado el mdico. Estos pueden incluir aerosoles nasales.  Si le recetaron un antibitico, tmelo como se lo haya indicado el mdico. No deje de tomar los antibiticos aunque comience a Actor. Hidrtese y [employer].  Beba suficiente agua para mantener la orina clara o de color amarillo plido. Mantenerse hidratado lo ayudar a [employer].  Use un humidificador de vapor fro para mantener la humedad de su hogar por encima del 50%.  Realice inhalaciones de vapor por 10 a , de 3 a 4veces al da o tal como se lo haya indicado el mdico. Puede hacer esto en el bao  con el vapor del agua caliente de la ducha.  Limite la exposicin al aire fro o seco. Reposo  Descanse todo lo que pueda.  Duerma con la cabeza elevada.  Asegrese de dormir lo suficiente cada noche. Instrucciones generales  Aplquese un pao tibio y hmedo en la cara 3 o 4veces al da o como se lo haya indicado el mdico. Esto ayuda a Optician, dispensing las [place].  Lvese las manos frecuentemente con agua y jabn para reducir la exposicin a virus y otras bacterias. Use desinfectante para manos si no dispone de [place] y [place].  No fume. Evite estar cerca de personas que fuman (fumador pasivo).  Concurra a todas las visitas de control como se lo haya indicado el mdico. Esto es importante. SOLICITE ATENCIN MDICA SI:  Lance Muss.  Los sntomas empeoran.  Los sntomas no mejoran en el trmino de 10das. SOLICITE ATENCIN MDICA DE INMEDIATO SI:  Tiene un dolor de cabeza intenso.  Tiene vmitos persistentes.  Tiene dolor o hinchazn en la zona del rostro o los ojos.  Tiene problemas de visin.  Se siente confundido.  Tiene el cuello rgido.  Tiene dificultad para respirar. Esta informacin no tiene Theme park manager el consejo del mdico. Asegrese de hacerle al mdico cualquier pregunta que tenga. Document Released: 05/09/2005 Document Revised:  11/21/2015 Document Reviewed: 05/25/2015 Elsevier Interactive Patient Education  2017 [employer].

## 2016-12-12 ENCOUNTER — Ambulatory Visit (INDEPENDENT_AMBULATORY_CARE_PROVIDER_SITE_OTHER): Payer: 59 | Admitting: Family Medicine

## 2016-12-12 ENCOUNTER — Encounter: Payer: Self-pay | Admitting: Family Medicine

## 2016-12-12 VITALS — BP 112/80 | HR 74 | Temp 98.6°F | Ht 66.0 in | Wt 168.0 lb

## 2016-12-12 DIAGNOSIS — M545 Low back pain, unspecified: Secondary | ICD-10-CM

## 2016-12-12 DIAGNOSIS — M79671 Pain in right foot: Secondary | ICD-10-CM | POA: Diagnosis not present

## 2016-12-12 MED ORDER — CYCLOBENZAPRINE HCL 5 MG PO TABS: 5.0000 mg | ORAL_TABLET | Freq: Two times a day (BID) | ORAL | 0 refills | Status: DC | PRN

## 2016-12-12 MED ORDER — NAPROXEN 500 MG PO TABS: ORAL_TABLET | ORAL | 0 refills | Status: DC

## 2016-12-12 NOTE — Assessment & Plan Note (Addendum)
Anticipate R>L plantar fasciitis vs heel spur. Treat with naprosyn  BID with meals x 1 week.  Discussed frozen water bottle massage, stretching, supportive shoes, and heel insert. Plantar fasciitis stretching exercises provided today.  Update if no better to refer to podiatry. Pt agrees with plan.

## 2016-12-12 NOTE — Progress Notes (Signed)
BP 112/80   Pulse 74   Temp 98.6 F (37 C)   Ht  (1.676 m)   Wt 168 lb (76.2 kg)   SpO2 99%   BMI 27.12 kg/m    CC: back pain, foot pain Subjective:    Patient ID: Megan Hayes, female    DOB: [birth year], 51 y.o.   MRN: [id number]  HPI: Megan Hayes is a 51 y.o. female presenting on 12/12/2016 for Plantar Fasciitis (Every morning, hard to walk.); Back Pain (Pain wakes her up at night. ); and Hand Pain (bilateral )   3 wk h/o R heel pain at sole. Noticing hard and tender spot medial posterior heel. Worst first few steps in the morning. 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 She has been able to change foot wear some with improvement (sketchers).  She has been treating with epsom salt soaks as well as naprosyn, ibuprofen, warm coconut oil with ginger.   h/o L plantar fasciitis treated with cortisone injection several months ago.  Ongoing lower back pain which is more of a chronic issue. At times wakes her up at night. Had to stop spinning due to lower back pain.   h/o bilat wrist surgeries for dequervain tenosynovitis 08/2016 Melvyn Novas). Has f/u planned with ortho - ongoing L wrist pain. Also endorses h/o CTS.   Relevant past medical, surgical, family and social history reviewed and updated as indicated. Interim medical history since our last visit reviewed. Allergies and medications reviewed and updated. Outpatient Medications Prior to Visit  Medication Sig Dispense Refill  . cholecalciferol (VITAMIN D) 1000 units tablet Take 1,000 Units by mouth daily.    . Multiple Vitamin (MULTIVITAMIN) tablet Take 1 tablet by mouth daily.    . Omega-3 Fatty Acids (FISH OIL) 1000 MG CAPS Take 2 capsules (2,000 mg total) by mouth daily.    Marland Kitchen doxycycline (VIBRA-TABS) 100 MG tablet Take 1 tablet (100 mg total) by mouth 2 (two) times daily. (Patient not taking: Reported on 12/12/2016) 20 tablet 0  . fluticasone (FLONASE) 50  MCG/ACT nasal spray Place 2 sprays into both nostrils daily. 16 g 6   No facility-administered medications prior to visit.      Per HPI unless specifically indicated in ROS section below Review of Systems     Objective:    BP 112/80   Pulse 74   Temp 98.6 F (37 C)   Ht  (1.676 m)   Wt 168 lb (76.2 kg)   SpO2 99%   BMI 27.12 kg/m   Wt Readings from Last 3 Encounters:  12/12/16 168 lb (76.2 kg)  11/15/16 169 lb 4 oz (76.8 kg)  08/18/16 162 lb (73.5 kg)    Physical Exam  Constitutional: She appears well-developed and well-nourished. No distress.  Musculoskeletal: Normal range of motion. She exhibits no edema.  Mild discomfort to palpation midline lumbar spine R>L lower lumbar paraspinous mm tenderness Neg SLR bilaterally No pain with int/ext rotation at hip No pain at SIJ, GTB or sciatic notch bilaterally Mild tenderness to palpation L heel sole  Moderate tenderness to palpation R medial heel sole No ankle ligament laxity  Skin: Skin is warm and dry. No rash noted.  Nursing note and vitals reviewed.  No results found for this or any previous visit.    Assessment & Plan:   Problem List Items Addressed This Visit    Foot pain, right -  Primary    Anticipate R>L plantar fasciitis vs heel spur. Treat with naprosyn  BID with meals x 1 week.  Discussed frozen water bottle massage, stretching, supportive shoes, and heel insert. Plantar fasciitis stretching exercises provided today.  Update if no better to refer to podiatry. Pt agrees with plan.       Lower back pain    Anticipate lumbar strain from favoring R leg with recent plantar fasciitis.  Rx naprosyn and flexeril PRN.       Relevant Medications   naproxen (NAPROSYN) 500 MG tablet   cyclobenzaprine (FLEXERIL) 5 MG tablet       Follow up plan: Return if symptoms worsen or fail to improve.  Eustaquio Boyden, MD

## 2016-12-12 NOTE — Patient Instructions (Signed)
Use naprosyn  dos veces al dia con comida por 1 semana, haga masaje con botella de agua congelada.  ejercicios para espalda y para fasciitis plantar.  No use tacones.  tambien puede ser bone spur en el pie derecho.  si no mejora, dejenos saber para ver al podiatra.

## 2016-12-12 NOTE — Assessment & Plan Note (Addendum)
Anticipate lumbar strain from favoring R leg with recent plantar fasciitis.  Rx naprosyn and flexeril PRN.

## 2016-12-12 NOTE — Progress Notes (Signed)
Pre visit review using our clinic review tool, if applicable. No additional management support is needed unless otherwise documented below in the visit note. 

## 2016-12-25 ENCOUNTER — Encounter (INDEPENDENT_AMBULATORY_CARE_PROVIDER_SITE_OTHER): Payer: Self-pay | Admitting: Vascular Surgery

## 2016-12-25 ENCOUNTER — Ambulatory Visit (INDEPENDENT_AMBULATORY_CARE_PROVIDER_SITE_OTHER): Payer: 59 | Admitting: Vascular Surgery

## 2016-12-25 VITALS — BP 126/81 | HR 81 | Resp 15 | Ht 66.0 in | Wt 169.0 lb

## 2016-12-25 DIAGNOSIS — M7989 Other specified soft tissue disorders: Secondary | ICD-10-CM | POA: Diagnosis not present

## 2016-12-25 DIAGNOSIS — E781 Pure hyperglyceridemia: Secondary | ICD-10-CM | POA: Diagnosis not present

## 2016-12-25 DIAGNOSIS — M79606 Pain in leg, unspecified: Secondary | ICD-10-CM | POA: Diagnosis not present

## 2016-12-25 NOTE — Progress Notes (Signed)
Subjective:    Patient ID: Megan Hayes, female    DOB: 02/02/1966, 51 y.o.   MRN: [id number] Chief Complaint  Patient presents with  . Re-evaluation    Bilateral leg pain   Patient last seen about 10 months ago. Presents today with worsening lower extremity pain. Endorses a history of wearing daily medical grade one compression stockings, elevating her legs and remaining active. Patient has noticed an increase in the size and tenderness of her varicose veins bilaterally despite conservative treatment. Her lower extremity varicose veins has become progressively more painful. Pain is worse with activity, standing or sitting for long periods of time. She states she has been taking OTC anti-inflammatories with minimal relief. Denies any fever, nausea or vomiting.    Review of Systems  Constitutional: Negative.   HENT: Negative.   Eyes: Negative.   Respiratory: Negative.   Cardiovascular:       Bilateral lower extremity pain  Gastrointestinal: Negative.   Endocrine: Negative.   Genitourinary: Negative.   Musculoskeletal: Negative.   Skin: Negative.   Allergic/Immunologic: Negative.   Neurological: Negative.   Hematological: Negative.   Psychiatric/Behavioral: Negative.       Objective:   Physical Exam  Constitutional: She is oriented to person, place, and time. She appears well-developed and well-nourished. No distress.  HENT:  Head: Normocephalic and atraumatic.  Eyes: Conjunctivae are normal. Pupils are equal, round, and reactive to light.  Neck: Normal range of motion.  Cardiovascular: Normal rate, regular rhythm, normal heart sounds and intact distal pulses.   Pulses:      Radial pulses are 2+ on the right side, and 2+ on the left side.       Dorsalis pedis pulses are 2+ on the right side, and 2+ on the left side.       Posterior tibial pulses are 2+ on the right side, and 2+ on the left side.  Pulmonary/Chest: Effort normal.  Musculoskeletal: Normal range of  motion. She exhibits no edema.  Neurological: She is alert and oriented to person, place, and time.  Skin: She is not diaphoretic.  Scattered <1cm and >1cm varicose veins bilaterally.  Psychiatric: She has a normal mood and affect. Her behavior is normal. Judgment and thought content normal.  Vitals reviewed.  BP 126/81 (BP Location: Right Arm)   Pulse 81   Resp 15   Ht 5\' 6"  (1.676 m)   Wt 169 lb (76.7 kg)   BMI 27.28 kg/m   Past Medical History:  Diagnosis Date  . Abnormal Pap smear of cervix    s/p LEEP then hysterectomy  . Endometriosis   . Hyperlipidemia   . Lactose intolerance in adult    Social History   Social History  . Marital status: Divorced    Spouse name: N/A  . Number of children: N/A  . Years of education: N/A   Occupational History  . Not on file.   Social History Main Topics  . Smoking status: Never Smoker  . Smokeless tobacco: Never Used  . Alcohol use 0.6 - 1.2 oz/week    1 - 2 Standard drinks or equivalent per week     Comment: occasional  . Drug use: No  . Sexual activity: Not on file   Other Topics Concern  . Not on file   Social History Narrative   Lives with BF, 2 dogs   2 children    Occ: marketing    Edu: bachelor's    Activity: no  regular exercise    Diet: good water, fruits/vegetables daily    Past Surgical History:  Procedure Laterality Date  . ABDOMINAL HYSTERECTOMY  2004   abnormal pap, ovaries remain [facility])  . COLONOSCOPY WITH PROPOFOL N/A 01/18/2016   WNL per patient (Seeplaputhur Wynona Luna, MD)  . REDUCTION MAMMAPLASTY Bilateral 2008  . REFRACTIVE SURGERY  1994  . TENOSYNOVECTOMY Right 08/18/2016   Procedure: TENOSYNOVECTOMY;  Surgeon: Bradly Bienenstock, MD;  Location: [facility] OR;  Service: Orthopedics;  Laterality: Right;  . TENOSYNOVECTOMY Left 05/2016   Melvyn Novas   Family History  Problem Relation Age of Onset  . Cancer Maternal Aunt 65       colon  . Cancer Maternal Uncle        liver  . Cancer Maternal Grandmother         uterine  . Cancer Maternal Grandfather        liver  . CAD Paternal Grandmother   . Cancer Maternal Uncle        throat  . CAD Paternal Uncle   . Diabetes Neg Hx    Allergies  Allergen Reactions  . Amoxicillin Diarrhea      Assessment & Plan:  Patient last seen about 10 months ago. Presents today with worsening lower extremity pain. Endorses a history of wearing daily medical grade one compression stockings, elevating her legs and remaining active. Patient has noticed an increase in the size and tenderness of her varicose veins bilaterally despite conservative treatment. Her lower extremity varicose veins has become progressively more painful. Pain is worse with activity, standing or sitting for long periods of time. She states she has been taking OTC anti-inflammatories with minimal relief. Denies any fever, nausea or vomiting.   1. Pain and swelling of lower extremity, unspecified laterality - Worsening Patient with worsening pain along varicosities. Varicose veins increasing in size as per patient. Will bring patient back for venous duplex to re-assess venous anatomy. Continues conservative therapy for now.  - VAS [place] LOWER EXTREMITY VENOUS REFLUX; Future  2. Pure hyperglyceridemia - Stable Encouraged good control as its slows the progression of atherosclerotic disease  Current Outpatient Prescriptions on File Prior to Visit  Medication Sig Dispense Refill  . cholecalciferol (VITAMIN D) 1000 units tablet Take 1,000 Units by mouth daily.    . cyclobenzaprine (FLEXERIL) 5 MG tablet Take 1 tablet (5 mg total) by mouth 2 (two) times daily as needed for muscle spasms. 30 tablet 0  . Multiple Vitamin (MULTIVITAMIN) tablet Take 1 tablet by mouth daily.    . naproxen (NAPROSYN) 500 MG tablet Take one po bid x 1 week then prn pain, take with food 40 tablet 0  . Omega-3 Fatty Acids (FISH OIL) 1000 MG CAPS Take 2 capsules (2,000 mg total) by mouth daily.     No current  facility-administered medications on file prior to visit.     There are no Patient Instructions on file for this visit. No Follow-up on file.   KIMBERLY A STEGMAYER, PA-C

## 2017-02-07 ENCOUNTER — Other Ambulatory Visit (INDEPENDENT_AMBULATORY_CARE_PROVIDER_SITE_OTHER): Payer: 59

## 2017-02-07 DIAGNOSIS — E781 Pure hyperglyceridemia: Secondary | ICD-10-CM | POA: Diagnosis not present

## 2017-02-07 LAB — COMPREHENSIVE METABOLIC PANEL
ALK PHOS: 69 U/L (ref 39–117)
ALT: 26 U/L (ref 0–35)
AST: 20 U/L (ref 0–37)
Albumin: 4.2 g/dL (ref 3.5–5.2)
BILIRUBIN TOTAL: 0.4 mg/dL (ref 0.2–1.2)
BUN: 14 mg/dL (ref 6–23)
CALCIUM: 9.8 mg/dL (ref 8.4–10.5)
CO2: 30 mEq/L (ref 19–32)
Chloride: 104 mEq/L (ref 96–112)
Creatinine, Ser: 0.83 mg/dL (ref 0.40–1.20)
GFR: 76.89 mL/min (ref >60.00)
GLUCOSE: 94 mg/dL (ref 70–99)
Potassium: 3.9 mEq/L (ref 3.5–5.1)
Sodium: 140 mEq/L (ref 135–145)
TOTAL PROTEIN: 7.1 g/dL (ref 6.0–8.3)

## 2017-02-07 LAB — LIPID PANEL
Cholesterol: 243 mg/dL — ABNORMAL HIGH (ref 0–200)
HDL: 49.8 mg/dL (ref >39.00)
NONHDL: 193.66
TRIGLYCERIDES: 222 mg/dL — AB (ref 0.0–149.0)
Total CHOL/HDL Ratio: 5
VLDL: 44.4 mg/dL — ABNORMAL HIGH (ref 0.0–40.0)

## 2017-02-07 LAB — LDL CHOLESTEROL, DIRECT: Direct LDL: 150 mg/dL

## 2017-02-11 ENCOUNTER — Ambulatory Visit (INDEPENDENT_AMBULATORY_CARE_PROVIDER_SITE_OTHER): Payer: 59 | Admitting: Family Medicine

## 2017-02-11 ENCOUNTER — Encounter: Payer: Self-pay | Admitting: Family Medicine

## 2017-02-11 VITALS — BP 114/80 | HR 77 | Temp 98.7°F | Wt 168.0 lb

## 2017-02-11 DIAGNOSIS — E782 Mixed hyperlipidemia: Secondary | ICD-10-CM | POA: Diagnosis not present

## 2017-02-11 DIAGNOSIS — K219 Gastro-esophageal reflux disease without esophagitis: Secondary | ICD-10-CM

## 2017-02-11 NOTE — Patient Instructions (Addendum)
Para dolor de estomago - probablemente empeoro la acidez por naproxen. trate probiotico diario por 1 semana. si no mejora, puede tomar prilosec o nexium (sin receta) 20mg  diarios por 3 semanas. Nos avisa si no mejora con esto. Siga pescado grasoso. Siga fish oil tablets.  Regresar en 1 ao para examen fisico.

## 2017-02-11 NOTE — Assessment & Plan Note (Signed)
Predominant hypertriglyceridemia. Reviewed FLP in detail with patient. Actual improvement noted (500s->200s). Continue fish oil, discussed increased fatty fish in diet. RTC 1 yr CPE.

## 2017-02-11 NOTE — Progress Notes (Signed)
BP 114/80 (BP Location: Left Arm, Patient Position: Sitting, Cuff Size: Normal)   Pulse 77   Temp 98.7 F (37.1 C) (Oral)   Wt 168 lb (76.2 kg)   SpO2 95%   BMI 27.12 kg/m    CC: 3 mo f/u visit, GERD Subjective:    Patient ID: Megan Hayes, female    DOB: 1965-12-02, 51 y.o.   MRN: [id number]  HPI: Megan Hayes is a 51 y.o. female presenting on 02/11/2017 for 3 month follow-up and Abdominal Pain   Seen here 3 mo ago to establish care.   She had R CTS recently, pending L side.   Ongoing plantar fasciitis - saw podiatry.   HLD - treated with fish oil 2gm daily.  The 10-year ASCVD risk score Denman George DC Montez Hageman., et al., 2013) is: 1.6%   Values used to calculate the score:     Age: 15 years     Sex: Female     Is Non-Hispanic African American: No     Diabetic: No     Tobacco smoker: No     Systolic Blood Pressure: 114 mmHg     Is BP treated: No     HDL Cholesterol: 49.8 mg/dL     Total Cholesterol: 243 mg/dL    Worsening GERD likely related to several meds she has recently taken (NSAIDs, tramadol, hydrocodone) - burning epigastric pain with meals.   Relevant past medical, surgical, family and social history reviewed and updated as indicated. Interim medical history since our last visit reviewed. Allergies and medications reviewed and updated. Outpatient Medications Prior to Visit  Medication Sig Dispense Refill  . cholecalciferol (VITAMIN D) 1000 units tablet Take 1,000 Units by mouth daily.    . Multiple Vitamin (MULTIVITAMIN) tablet Take 1 tablet by mouth daily.    . naproxen (NAPROSYN) 500 MG tablet Take one po bid x 1 week then prn pain, take with food 40 tablet 0  . cyclobenzaprine (FLEXERIL) 5 MG tablet Take 1 tablet (5 mg total) by mouth 2 (two) times daily as needed for muscle spasms. 30 tablet 0  . Omega-3 Fatty Acids (FISH OIL) 1000 MG CAPS Take 2 capsules (2,000 mg total) by mouth daily. (Patient not taking: Reported on 02/11/2017)     No  facility-administered medications prior to visit.      Per HPI unless specifically indicated in ROS section below Review of Systems     Objective:    BP 114/80 (BP Location: Left Arm, Patient Position: Sitting, Cuff Size: Normal)   Pulse 77   Temp 98.7 F (37.1 C) (Oral)   Wt 168 lb (76.2 kg)   SpO2 95%   BMI 27.12 kg/m   Wt Readings from Last 3 Encounters:  02/11/17 168 lb (76.2 kg)  12/25/16 169 lb (76.7 kg)  12/12/16 168 lb (76.2 kg)    Physical Exam  Constitutional: She appears well-developed and well-nourished. No distress.  Psychiatric: She has a normal mood and affect.  Nursing note and vitals reviewed.  Results for orders placed or performed in visit on 02/07/17  Lipid panel  Result Value Ref Range   Cholesterol 243 (H) 0 - 200 mg/dL   Triglycerides 132.4 (H) 0.0 - 149.0 mg/dL   HDL 40.10 >27.25 mg/dL   VLDL 36.6 (H) 0.0 - 44.0 mg/dL   Total CHOL/HDL Ratio 5    NonHDL 193.66   Comprehensive metabolic panel  Result Value Ref Range   Sodium 140 135 -  145 mEq/L   Potassium 3.9 3.5 - 5.1 mEq/L   Chloride 104 96 - 112 mEq/L   CO2 30 19 - 32 mEq/L   Glucose, Bld 94 70 - 99 mg/dL   BUN 14 6 - 23 mg/dL   Creatinine, Ser 2.950.83 0.40 - 1.20 mg/dL   Total Bilirubin 0.4 0.2 - 1.2 mg/dL   Alkaline Phosphatase 69 39 - 117 U/L   AST 20 0 - 37 U/L   ALT 26 0 - 35 U/L   Total Protein 7.1 6.0 - 8.3 g/dL   Albumin 4.2 3.5 - 5.2 g/dL   Calcium 9.8 8.4 - 62.110.5 mg/dL   GFR 30.8676.89 >57.84>60.00 mL/min  LDL cholesterol, direct  Result Value Ref Range   Direct LDL 150.0 mg/dL      Assessment & Plan:   Problem List Items Addressed This Visit    GERD (gastroesophageal reflux disease)    Anticipate worsening symptoms due to recent NSAID use. Discussed with patient. rec stop NSAID. Pt desires to try probiotic to improve symptoms. Advised if no improvement, to start 3 wk course of OTC PPI. Pt agrees with plan.       Hyperlipidemia - Primary    Predominant hypertriglyceridemia.  Reviewed FLP in detail with patient. Actual improvement noted (500s->200s). Continue fish oil, discussed increased fatty fish in diet. RTC 1 yr CPE.           Follow up plan: Return in about 1 year (around 02/11/2018) for annual exam, prior fasting for blood work.  Eustaquio BoydenJavier Haydee Jabbour, MD

## 2017-02-11 NOTE — Assessment & Plan Note (Signed)
Anticipate worsening symptoms due to recent NSAID use. Discussed with patient. rec stop NSAID. Pt desires to try probiotic to improve symptoms. Advised if no improvement, to start 3 wk course of OTC PPI. Pt agrees with plan.

## 2017-02-12 ENCOUNTER — Encounter: Payer: Self-pay | Admitting: Family Medicine

## 2017-03-05 ENCOUNTER — Ambulatory Visit: Payer: Self-pay | Admitting: Family Medicine

## 2017-03-07 ENCOUNTER — Ambulatory Visit (INDEPENDENT_AMBULATORY_CARE_PROVIDER_SITE_OTHER): Payer: 59 | Admitting: Family Medicine

## 2017-03-07 ENCOUNTER — Encounter: Payer: Self-pay | Admitting: Family Medicine

## 2017-03-07 ENCOUNTER — Other Ambulatory Visit (INDEPENDENT_AMBULATORY_CARE_PROVIDER_SITE_OTHER): Payer: 59

## 2017-03-07 VITALS — BP 112/60 | HR 86 | Temp 98.6°F | Wt 169.8 lb

## 2017-03-07 DIAGNOSIS — R1013 Epigastric pain: Secondary | ICD-10-CM | POA: Diagnosis not present

## 2017-03-07 DIAGNOSIS — S99922A Unspecified injury of left foot, initial encounter: Secondary | ICD-10-CM | POA: Insufficient documentation

## 2017-03-07 DIAGNOSIS — M7989 Other specified soft tissue disorders: Secondary | ICD-10-CM | POA: Diagnosis not present

## 2017-03-07 DIAGNOSIS — M79606 Pain in leg, unspecified: Secondary | ICD-10-CM | POA: Diagnosis not present

## 2017-03-07 DIAGNOSIS — E739 Lactose intolerance, unspecified: Secondary | ICD-10-CM | POA: Diagnosis not present

## 2017-03-07 DIAGNOSIS — K219 Gastro-esophageal reflux disease without esophagitis: Secondary | ICD-10-CM | POA: Diagnosis not present

## 2017-03-07 DIAGNOSIS — R197 Diarrhea, unspecified: Secondary | ICD-10-CM

## 2017-03-07 DIAGNOSIS — K589 Irritable bowel syndrome without diarrhea: Secondary | ICD-10-CM | POA: Insufficient documentation

## 2017-03-07 MED ORDER — PANTOPRAZOLE SODIUM 40 MG PO TBEC: 40.0000 mg | DELAYED_RELEASE_TABLET | Freq: Every day | ORAL | 1 refills | Status: DC

## 2017-03-07 NOTE — Addendum Note (Signed)
Addended by: Eustaquio BoydenGUTIERREZ, Horst Ostermiller on: 03/07/2017 06:50 PM   Modules accepted: Level of Service

## 2017-03-07 NOTE — Assessment & Plan Note (Signed)
Ongoing - check GI pathogen panel

## 2017-03-07 NOTE — Progress Notes (Addendum)
BP 112/60   Pulse 86   Temp 98.6 F (37 C) (Oral)   Wt 169 lb 12 oz (77 kg)   SpO2 99%   BMI 27.40 kg/m    CC: abd discomfort Subjective:    Patient ID: Megan Hayes, female    DOB: 06/03/1966, 51 y.o.   MRN: [id number]  HPI: Megan Hayes is a 51 y.o. female presenting on 03/07/2017 for dry mouth and Diarrhea   Intermittent diarrhea with lactose. Last week also had abdominal bloating and discomfort. Noticed dry mouth as well as white tongue and mouth, halitosis, swelling of tip of tongue and harshness to tongue without pain. Increased stomach sensitivity to certain foods (lentils, soups) - with liquid diarrhea. Now with diarrhea even to lactose free milk and almond milk.   Last week self treated with warm water and lemon and bicarb over weekend with some improvement.  Then this week sxs recurred - diarrhea, stomach upset, headaches in the mornings.  Some discomfort on an empty stomach. Some chills.   No fevers, blood in stool.  No new foods.  No recent travel.  Son may have had stomach issues recently.  Uses city water. Uses bottled water  She has been taking probiotics.   No recent antibiotic use (last 11/2016). No recent antibiotic use.  Upcoming L CTS release surgery.   H/o H pylori s/p treatment remotely with "gel de calendula" camomile gel.   Hit left pinky toe while walking at home, bruising and swelling and pain present  Relevant past medical, surgical, family and social history reviewed and updated as indicated. Interim medical history since our last visit reviewed. Allergies and medications reviewed and updated. Outpatient Medications Prior to Visit  Medication Sig Dispense Refill  . Omega-3 Fatty Acids (FISH OIL) 1000 MG CAPS Take 2 capsules (2,000 mg total) by mouth daily.    . naproxen (NAPROSYN) 500 MG tablet Take one po bid x 1 week then prn pain, take with food 40 tablet 0  . cholecalciferol (VITAMIN D) 1000 units tablet Take  1,000 Units by mouth daily.    . Multiple Vitamin (MULTIVITAMIN) tablet Take 1 tablet by mouth daily.     No facility-administered medications prior to visit.      Per HPI unless specifically indicated in ROS section below Review of Systems     Objective:    BP 112/60   Pulse 86   Temp 98.6 F (37 C) (Oral)   Wt 169 lb 12 oz (77 kg)   SpO2 99%   BMI 27.40 kg/m   Wt Readings from Last 3 Encounters:  03/07/17 169 lb 12 oz (77 kg)  02/11/17 168 lb (76.2 kg)  12/25/16 169 lb (76.7 kg)    Physical Exam  Constitutional: She appears well-developed and well-nourished. No distress.  HENT:  Mouth/Throat: Oropharynx is clear and moist. No oropharyngeal exudate, posterior oropharyngeal edema, posterior oropharyngeal erythema or tonsillar abscesses.  No film on tongue, no swelling or fissues of tongue  Neck: No thyromegaly present.  Cardiovascular: Normal rate, regular rhythm, normal heart sounds and intact distal pulses.   No murmur heard. Pulmonary/Chest: Effort normal and breath sounds normal. No respiratory distress. She has no wheezes. She has no rales.  Abdominal: Soft. Normal appearance and bowel sounds are normal. She exhibits no distension and no mass. There is no hepatosplenomegaly. There is tenderness (mild) in the epigastric area. There is no rebound and no guarding.  Musculoskeletal: She exhibits no  edema.  L fifth toe swollen with bruising, painful abduction No dislocation of toe No pain with axial load at toe No pain at MTPJ  Lymphadenopathy:    She has no cervical adenopathy.  Skin: Skin is warm and dry. No rash noted.  Psychiatric: She has a normal mood and affect.  Nursing note and vitals reviewed.  Results for orders placed or performed in visit on 02/07/17  Lipid panel  Result Value Ref Range   Cholesterol 243 (H) 0 - 200 mg/dL   Triglycerides 604.5222.0 (H) 0.0 - 149.0 mg/dL   HDL 40.9849.80 >11.91>39.00 mg/dL   VLDL 47.844.4 (H) 0.0 - 29.540.0 mg/dL   Total CHOL/HDL Ratio 5     NonHDL 193.66   Comprehensive metabolic panel  Result Value Ref Range   Sodium 140 135 - 145 mEq/L   Potassium 3.9 3.5 - 5.1 mEq/L   Chloride 104 96 - 112 mEq/L   CO2 30 19 - 32 mEq/L   Glucose, Bld 94 70 - 99 mg/dL   BUN 14 6 - 23 mg/dL   Creatinine, Ser 6.210.83 0.40 - 1.20 mg/dL   Total Bilirubin 0.4 0.2 - 1.2 mg/dL   Alkaline Phosphatase 69 39 - 117 U/L   AST 20 0 - 37 U/L   ALT 26 0 - 35 U/L   Total Protein 7.1 6.0 - 8.3 g/dL   Albumin 4.2 3.5 - 5.2 g/dL   Calcium 9.8 8.4 - 30.810.5 mg/dL   GFR 65.7876.89 >46.96>60.00 mL/min  LDL cholesterol, direct  Result Value Ref Range   Direct LDL 150.0 mg/dL      Assessment & Plan:   Problem List Items Addressed This Visit    Diarrhea    Ongoing - check GI pathogen panel      Relevant Orders   Gastrointestinal Pathogen Panel PCR   Epigastric abdominal pain - Primary    Nonspecific epigastric discomfort associated with GI distress, intermittent diarrhea, headache and tongue swelling. ?IBS-D, PUD, H pylori. No better with regular probiotic use. Worse on empty stomack.  Will treat with protonix 40mg  daily x 2 wks then PRN. Benign oral exam.  In h/o H pylori, check for Ag.  In ongoing intermittent watery diarrhea, check GI pathogen panel.  This does not sound consistent with celiac disease but if ongoing, low threshold to check CBC, anti-endomysial IgA.  Known lactose intolerance.       Relevant Orders   Helicobacter pylori special antigen   GERD (gastroesophageal reflux disease)    Rx protonix 40mg  daily.      Relevant Medications   Lactobacillus (PROBIOTIC ACIDOPHILUS PO)   pantoprazole (PROTONIX) 40 MG tablet   Other Relevant Orders   Helicobacter pylori special antigen   Lactose intolerance in adult   Toe injury, left, initial encounter    Anticipate toe sprain, no signs of dislocation - recommend buddy taping - done today.           Follow up plan: Return if symptoms worsen or fail to improve.  Eustaquio BoydenJavier Alfa Leibensperger, MD

## 2017-03-07 NOTE — Patient Instructions (Addendum)
Labs today - stool studies.  Creo que tiene [employer] Resources[employer]omago iritable.  Puede usar biotene enjuague bucal.  Tratemos sintomas con pantoprazole 40mg  diarios por 2 semanas.  Regrese examen de heces para revisar bacteria (H pylori).  Dejenos saber si no mejora con esto.

## 2017-03-07 NOTE — Assessment & Plan Note (Signed)
Rx protonix 40mg  daily.

## 2017-03-07 NOTE — Assessment & Plan Note (Addendum)
Anticipate toe sprain, no signs of dislocation - recommend buddy taping - done today.

## 2017-03-07 NOTE — Assessment & Plan Note (Addendum)
Nonspecific epigastric discomfort associated with GI distress, intermittent diarrhea, headache and tongue swelling. ?IBS-D, PUD, H pylori. No better with regular probiotic use. Worse on empty stomack.  Will treat with protonix 40mg  daily x 2 wks then PRN. Benign oral exam.  In h/o H pylori, check for Ag.  In ongoing intermittent watery diarrhea, check GI pathogen panel.  This does not sound consistent with celiac disease but if ongoing, low threshold to check CBC, anti-endomysial IgA.  Known lactose intolerance.

## 2017-03-11 LAB — HELICOBACTER PYLORI  SPECIAL ANTIGEN: H. PYLORI Antigen: NOT DETECTED

## 2017-03-11 LAB — GASTROINTESTINAL PATHOGEN PANEL PCR
C. DIFFICILE TOX A/B, PCR: NOT DETECTED
Campylobacter, PCR: NOT DETECTED
Cryptosporidium, PCR: NOT DETECTED
E COLI (STEC) STX1/STX2, PCR: NOT DETECTED
E coli (ETEC) LT/ST PCR: NOT DETECTED
E coli 0157, PCR: NOT DETECTED
Giardia lamblia, PCR: NOT DETECTED
Norovirus, PCR: NOT DETECTED
ROTAVIRUS, PCR: NOT DETECTED
SALMONELLA, PCR: NOT DETECTED
SHIGELLA, PCR: NOT DETECTED

## 2017-03-19 ENCOUNTER — Encounter (INDEPENDENT_AMBULATORY_CARE_PROVIDER_SITE_OTHER): Payer: Self-pay | Admitting: Vascular Surgery

## 2017-03-25 ENCOUNTER — Encounter (INDEPENDENT_AMBULATORY_CARE_PROVIDER_SITE_OTHER): Payer: Self-pay | Admitting: Vascular Surgery

## 2017-03-29 ENCOUNTER — Ambulatory Visit (INDEPENDENT_AMBULATORY_CARE_PROVIDER_SITE_OTHER): Payer: 59 | Admitting: Obstetrics & Gynecology

## 2017-03-29 ENCOUNTER — Encounter: Payer: Self-pay | Admitting: Family Medicine

## 2017-03-29 ENCOUNTER — Encounter: Payer: Self-pay | Admitting: Obstetrics & Gynecology

## 2017-03-29 VITALS — BP 100/70 | HR 82 | Ht 66.0 in | Wt 170.0 lb

## 2017-03-29 DIAGNOSIS — R1013 Epigastric pain: Secondary | ICD-10-CM

## 2017-03-29 DIAGNOSIS — R197 Diarrhea, unspecified: Secondary | ICD-10-CM

## 2017-03-29 DIAGNOSIS — N898 Other specified noninflammatory disorders of vagina: Secondary | ICD-10-CM

## 2017-03-29 MED ORDER — ESTRADIOL 0.1 MG/GM VA CREA: 1.0000 | TOPICAL_CREAM | VAGINAL | 3 refills | Status: DC

## 2017-03-29 NOTE — Telephone Encounter (Signed)
Spoke with patient. Ongoing diarrhea past 1-2 months.  Diarrhea after each meal.  Pt would like to try b complex for possible b12 deficiency as well as I recommended continue probiotic and protonix.  Advised to come in on Thursday morning around 9-10am for labs plz place on lab schedule  -thanks

## 2017-03-29 NOTE — Progress Notes (Signed)
Postmenopausal Discharge with scant Bleeding Patient complains of vaginal discharge with scant bleeding on occasion. She has been menopausal based on sx's of hot flashes for a few years.  She has had prior TAH for cervical dysplasia and fibroids many years ago (2011). Currently on no HRT. Discharge is brown w occas red and is cyclical on a monthly basis.  Stopped having hot flashes last year.  No vag dryness or dyspareunia.  Menstrual History: OB History    Gravida Para Term Preterm AB Living   3 2     1 2    SAB TAB Ectopic Multiple Live Births                  Obstetric Comments   1st Menstrual Cycle:  12 1st Pregnancy:  20      No LMP recorded. Patient has had a hysterectomy.   PMHx: She  has a past medical history of Abnormal Pap smear of cervix; Endometriosis; Hyperlipidemia; and Lactose intolerance in adult. Also,  has a past surgical history that includes Colonoscopy with propofol (N/A, 01/18/2016); Tenosynovectomy (Right, 08/18/2016); Abdominal hysterectomy (2004); Reduction mammaplasty (Bilateral, 2008); Refractive surgery (1994); and Tenosynovectomy (Left, 05/2016)., family history includes CAD in her paternal grandmother and paternal uncle; Cancer in her maternal grandfather, maternal grandmother, maternal uncle, and maternal uncle; Cancer (age of onset: 59) in her maternal aunt.,  reports that she has never smoked. She has never used smokeless tobacco. She reports that she drinks about 0.6 - 1.2 oz of alcohol per week . She reports that she does not use drugs.  She has a current medication list which includes the following prescription(s): cholecalciferol, estradiol, lactobacillus, multivitamin, fish oil, and pantoprazole. Also, is allergic to amoxicillin.  Review of Systems  Constitutional: Negative for chills, fever and malaise/fatigue.  HENT: Negative for congestion, sinus pain and sore throat.   Eyes: Negative for blurred vision and pain.  Respiratory: Negative for cough and  wheezing.   Cardiovascular: Negative for chest pain and leg swelling.  Gastrointestinal: Negative for abdominal pain, constipation, diarrhea, heartburn, nausea and vomiting.  Genitourinary: Negative for dysuria, frequency, hematuria and urgency.  Musculoskeletal: Negative for back pain, joint pain, myalgias and neck pain.  Skin: Negative for itching and rash.  Neurological: Negative for dizziness, tremors and weakness.  Endo/Heme/Allergies: Does not bruise/bleed easily.  Psychiatric/Behavioral: Negative for depression. The patient is not nervous/anxious and does not have insomnia.     Objective: BP 100/70   Pulse 82   Ht 5\' 6"  (1.676 m)   Wt 170 lb (77.1 kg)   BMI 27.44 kg/m  Physical Exam  Constitutional: She is oriented to person, place, and time. She appears well-developed and well-nourished. No distress.  Genitourinary: Vagina normal. Pelvic exam was performed with patient supine. There is no rash, tenderness or lesion on the right labia. There is no rash, tenderness or lesion on the left labia. No erythema or bleeding in the vagina.  Genitourinary Comments: Cuff intact/ no lesions Absent uterus and cervix Normal mucosa without atrophy  Abdominal: Soft. She exhibits no distension. There is no tenderness.  Musculoskeletal: Normal range of motion.  Neurological: She is alert and oriented to person, place, and time. No cranial nerve deficit.  Skin: Skin is warm and dry.  Psychiatric: She has a normal mood and affect.   ASSESSMENT/PLAN:    Problem List Items Addressed This Visit    None    Visit Diagnoses    Vaginal discharge    -  Primary  Unclear etiology, but no immediate concerns for neoplasia or infection. Vag Estrogen twice weekly may help, only real option at this time.  Annamarie Major, MD, [facility], [facility] 03/29/2017  4:04 PM

## 2017-03-29 NOTE — Patient Instructions (Signed)
Estradiol vaginal cream What is this medicine? ESTRADIOL (es tra DYE ole) contains the female hormone estrogen. It is used for symptoms of menopause, like vaginal dryness and irritation. This medicine may be used for other purposes; ask your health care provider or pharmacist if you have questions. COMMON BRAND NAME(S): Estrace What should I tell my health care provider before I take this medicine? They need to know if you have any of these conditions: -abnormal vaginal bleeding -blood vessel disease or blood clots -breast, cervical, endometrial, ovarian, liver, or uterine cancer -dementia -diabetes -gallbladder disease -heart disease or recent heart attack -high blood pressure -high cholesterol -high levels of calcium in the blood -hysterectomy -kidney disease -liver disease -migraine headaches -protein C deficiency -protein S deficiency -stroke -systemic lupus erythematosus (SLE) -tobacco smoker -an unusual or allergic reaction to estrogens, other hormones, soy, other medicines, foods, dyes, or preservatives -pregnant or trying to get pregnant -breast-feeding How should I use this medicine? This medicine is for use in the vagina only. Do not take by mouth. Follow the directions on the prescription label. Read package directions carefully before using. Use the special applicator supplied with the cream. Wash hands before and after use. Fill the applicator with the prescribed amount of cream. Lie on your back, part and bend your knees. Insert the applicator into the vagina and push the plunger to expel the cream into the vagina. Wash the applicator with warm soapy water and rinse well. Use exactly as directed for the complete length of time prescribed. Do not stop using except on the advice of your doctor or health care professional. A patient package insert for the product will be given with each prescription and refill. Read this sheet carefully each time. The sheet may change  frequently. Talk to your pediatrician regarding the use of this medicine in children. This medicine is not approved for use in children. Overdosage: If you think you have taken too much of this medicine contact a poison control center or emergency room at once. NOTE: This medicine is only for you. Do not share this medicine with others. What if I miss a dose? If you miss a dose, use it as soon as you can. If it is almost time for your next dose, use only that dose. Do not use double or extra doses. What may interact with this medicine? Do not take this medicine with any of the following medications: -aromatase inhibitors like aminoglutethimide, anastrozole, exemestane, letrozole, testolactone This medicine may also interact with the following medications: -barbiturates used for inducing sleep or treating seizures -carbamazepine -grapefruit juice -medicines for fungal infections like ketoconazole and itraconazole -raloxifene -rifabutin -rifampin -rifapentine -ritonavir -some antibiotics used to treat infections -St. John's Wort -tamoxifen -warfarin This list may not describe all possible interactions. Give your health care provider a list of all the medicines, herbs, non-prescription drugs, or dietary supplements you use. Also tell them if you smoke, drink alcohol, or use illegal drugs. Some items may interact with your medicine. What should I watch for while using this medicine? Visit your health care professional for regular checks on your progress. You will need a regular breast and pelvic exam. You should also discuss the need for regular mammograms with your health care professional, and follow his or her guidelines. This medicine can make your body retain fluid, making your fingers, hands, or ankles swell. Your blood pressure can go up. Contact your doctor or health care professional if you feel you are retaining fluid. If you have  any reason to think you are pregnant, stop taking  this medicine at once and contact your doctor or health care professional. Tobacco smoking increases the risk of getting a blood clot or having a stroke, especially if you are more than 51 years old. You are strongly advised not to smoke. If you wear contact lenses and notice visual changes, or if the lenses begin to feel uncomfortable, consult your eye care specialist. If you are going to have elective surgery, you may need to stop taking this medicine beforehand. Consult your health care professional for advice prior to scheduling the surgery. What side effects may I notice from receiving this medicine? Side effects that you should report to your doctor or health care professional as soon as possible: -allergic reactions like skin rash, itching or hives, swelling of the face, lips, or tongue -breast tissue changes or discharge -changes in vision -chest pain -confusion, trouble speaking or understanding -dark urine -general ill feeling or flu-like symptoms -light-colored stools -nausea, vomiting -pain, swelling, warmth in the leg -right upper belly pain -severe headaches -shortness of breath -sudden numbness or weakness of the face, arm or leg -trouble walking, dizziness, loss of balance or coordination -unusual vaginal bleeding -yellowing of the eyes or skin Side effects that usually do not require medical attention (report to your doctor or health care professional if they continue or are bothersome): -hair loss -increased hunger or thirst -increased urination -symptoms of vaginal infection like itching, irritation or unusual discharge -unusually weak or tired This list may not describe all possible side effects. Call your doctor for medical advice about side effects. You may report side effects to FDA at 1-800-FDA-1088. Where should I keep my medicine? Keep out of the reach of children. Store at room temperature between 15 and 30 degrees C (59 and 86 degrees F). Protect from  temperatures above 40 degrees C (104 degrees C). Do not freeze. Throw away any unused medicine after the expiration date. NOTE: This sheet is a summary. It may not cover all possible information. If you have questions about this medicine, talk to your doctor, pharmacist, or health care provider.  2018 Elsevier/Gold Standard (2010-11-01 09:18:12)  Estradiol vaginal cream Qu es este medicamento? El ESTRADIOL contiene una hormona estrognica femenina. Se utiliza para los sntomas de la menopausia, como irritacin y sequedad vaginal. Este medicamento puede ser utilizado para otros usos; si tiene alguna pregunta consulte con su proveedor de atencin mdica o con su farmacutico. MARCAS COMUNES: Estrace Qu le debo informar a mi profesional de la salud antes de tomar este medicamento? Necesitan saber si usted presenta alguno de los siguientes problemas o situaciones: sangrado vaginal anormal enfermedad vascular o cogulos sanguneos cncer de mama, cervical, endometrio, ovario, hgado o uterino demencia diabetes enfermedad de la vescula biliar enfermedad cardiaca o ataque cardiaco reciente presin sangunea alta alto nivel de colesterol altos niveles de calcio en la sangre histerectoma enfermedad renal enfermedad heptica migraas deficiencia de protena C deficiencia de protena S derrame cerebral lupus eritematoso sistmico (LES) fuma tabaco una reaccin alrgica o inusual a los estrgenos, a otras hormonas, a la soya, a otros medicamentos, alimentos, colorantes o conservantes si est embarazada o buscando quedar embarazada si est amamantando a un beb Cmo debo [employer]? Este medicamento es para utilizarse solamente en la vagina. No lo ingiera por va oral. Siga las instrucciones de la etiqueta del medicamento. Lea atentamente las instrucciones del envase antes de usarlo. Use el aplicador especial que viene con la crema. Lvese  las manos antes y despus de usarlo. Llene el aplicador  con la cantidad indicada de crema. Recustese de espaldas, separe y flexione las rodillas. Inserte el aplicador en la vagina y empuje el mbolo para expulsar la crema en la vagina. Lave el aplicador con agua tibia y jabn y enjuague bien. Use exactamente segn las instrucciones durante todo el periodo indicado. No deje de usarlo, excepto si as lo indica su mdico o su profesional de [employer]. Recibir un folleto de informacin para el paciente con cada receta y en cada ocasin que la vuelva a surtir. Asegrese de leer este folleto cada vez cuidadosamente. Este folleto puede cambiar con frecuencia. Hable con su pediatra para informarse acerca del uso de este medicamento en nios. Este medicamento no est aprobado para uso en nios. Sobredosis: Pngase en contacto inmediatamente con un centro toxicolgico o una sala de urgencia si usted cree que haya tomado demasiado medicamento. ATENCIN: [employer] es solo para usted. No comparta este medicamento con nadie. Qu sucede si me olvido de una dosis? Si olvida una dosis, aplquela lo antes posible. Si es casi la hora de la prxima dosis, aplique slo esa dosis. No use dosis adicionales o dobles. Qu puede interactuar con este medicamento? No tome esta medicina con ninguno de los siguientes medicamentos: -inhibidores de Chiropodist, tales como aminoglutetimida, anastrozol, exemestno, letrozol, testolactona Esta medicina tambin puede interactuar con los siguientes medicamentos: -barbitricos para inducir el sueo o para el tratamiento de convulsiones -carbamazepina -jugo de toronja -medicamentos para infecciones micticas, tales como itraconazol y Tax adviser -raloxifeno -rifabutina -rifampicina -rifapentina -ritonavir -hierba de [place] -tamoxifeno -warfarina Puede ser que esta lista no menciona todas las posibles interacciones. Informe a su profesional de [employer] de [employer] productos a base de hierbas, medicamentos de [place] o  suplementos nutritivos que est tomando. Si usted fuma, consume bebidas alcohlicas o si utiliza drogas ilegales, indqueselo tambin a su profesional de [employer]. Algunas sustancias pueden interactuar con su medicamento. A qu debo estar atento al usar [employer]? Visite a su mdico o a su profesional de la salud para chequear su evolucin peridicamente. Debe hacerse exmenes de las mamas y la pelvis en forma regular. Tambin debe discutir con su profesional de la salud la necesidad de Product manager peridicamente y seguir las pautas que este profesional establezca para estas pruebas. Este medicamento puede hacer que su cuerpo retenga lquido, lo que puede provocar que se le hinchen los dedos, manos o tobillos. Su presin sangunea Manufacturing engineer. Comunquese con su mdico o con su profesional de la salud si siente que est reteniendo lquido. Si tiene algn motivo para pensar que est embarazada, deje de tomar este medicamento de inmediato y comunquese con su mdico o con su profesional de Radiographer, therapeutic. El fumar tabaco aumenta el riesgo de formacin de cogulos o de sufrir un derrame cerebral, especialmente si tiene ms de 35 aos de edad. Se le recomienda enfticamente que no fume. Si [place] lentes de contacto y observa cambios en la visin, o si los lentes comienzan a resultarle incmodos, consulte con su mdico de los ojos. Si va a someterse a Associate Professor, tal vez deba dejar de usar este medicamento de antemano. Consulte con su profesional de la salud antes de Nurse, learning disability operacin. Qu efectos secundarios puedo tener al [employer] este medicamento? Efectos secundarios que debe informar a su mdico o a Producer, television/film/video de la salud tan pronto como sea posible: -reacciones alrgicas como erupcin cutnea, picazn o  urticarias, hinchazn de la cara, labios o lengua -secreciones o cambios en el tejido de las mamas -cambios en la visin -dolor en el pecho -confusin, dificultad para  hablar o entender -orina de color amarillo oscuro -sensacin general de estar enfermo o sntomas gripales -heces claras -nuseas o vmito -dolor, hinchazn, sensacin clida en las piernas -dolor en la regin abdominal superior derecha -dolores de cabeza severos -falta de aliento repentina -debilidad o entumecimiento repentino de la cara, brazos o piernas -problemas para caminar, mareos, prdida de la coordinacin o equilibrio -sangrado vaginal inusual -color amarillento de los ojos o la piel Efectos secundarios que, por lo general, no requieren Psychologist, prison and probation services (debe informarlos a su mdico o a Producer, television/film/video de la salud si persisten o si son molestos): -cada del cabello -aumento de apetito o sed -aumento de descargas de orina -sntomas de una infeccin vaginal, como picazn, irritacin o flujo inusual -cansancio o debilidad inusual Puede ser que esta lista no menciona todos los posibles efectos secundarios. Comunquese a su mdico por asesoramiento mdico [employer]. Usted puede informar los efectos secundarios a la FDA por telfono al 1-800-FDA-1088. Dnde debo guardar mi medicina? Mantngala fuera del alcance de los nios. Gurdela a [employer], entre 15 y 30 grados C (22 y 54 grados F). Protjala de las temperaturas superiores de 40 grados C (104 grados C). No la congele. Deseche todo el medicamento que no haya utilizado, despus de la fecha de vencimiento. ATENCIN: Este folleto es un resumen. Puede ser que no cubra toda la posible informacin. Si usted tiene preguntas acerca de esta medicina, consulte con su mdico, su farmacutico o su profesional de Radiographer, therapeutic.  2018 Elsevier/Gold Standard (2016-08-30 00:00:00)

## 2017-04-02 ENCOUNTER — Other Ambulatory Visit (INDEPENDENT_AMBULATORY_CARE_PROVIDER_SITE_OTHER): Payer: 59

## 2017-04-02 DIAGNOSIS — R1013 Epigastric pain: Secondary | ICD-10-CM

## 2017-04-02 DIAGNOSIS — R197 Diarrhea, unspecified: Secondary | ICD-10-CM | POA: Diagnosis not present

## 2017-04-02 LAB — CBC WITH DIFFERENTIAL/PLATELET
Basophils Absolute: 0 10*3/uL (ref 0.0–0.1)
Basophils Relative: 0.5 % (ref 0.0–3.0)
EOS PCT: 3.8 % (ref 0.0–5.0)
Eosinophils Absolute: 0.2 10*3/uL (ref 0.0–0.7)
HCT: 38.5 % (ref 36.0–46.0)
Hemoglobin: 12.5 g/dL (ref 12.0–15.0)
LYMPHS ABS: 2 10*3/uL (ref 0.7–4.0)
Lymphocytes Relative: 37.7 % (ref 12.0–46.0)
MCHC: 32.6 g/dL (ref 30.0–36.0)
MCV: 80.7 fl (ref 78.0–100.0)
MONOS PCT: 8.2 % (ref 3.0–12.0)
Monocytes Absolute: 0.4 10*3/uL (ref 0.1–1.0)
NEUTROS ABS: 2.6 10*3/uL (ref 1.4–7.7)
NEUTROS PCT: 49.8 % (ref 43.0–77.0)
PLATELETS: 318 10*3/uL (ref 150.0–400.0)
RBC: 4.77 Mil/uL (ref 3.87–5.11)
RDW: 14.1 % (ref 11.5–15.5)
WBC: 5.2 10*3/uL (ref 4.0–10.5)

## 2017-04-02 LAB — COMPREHENSIVE METABOLIC PANEL
ALK PHOS: 70 U/L (ref 39–117)
ALT: 22 U/L (ref 0–35)
AST: 19 U/L (ref 0–37)
Albumin: 3.8 g/dL (ref 3.5–5.2)
BUN: 12 mg/dL (ref 6–23)
CALCIUM: 9.3 mg/dL (ref 8.4–10.5)
CO2: 33 mEq/L — ABNORMAL HIGH (ref 19–32)
Chloride: 106 mEq/L (ref 96–112)
Creatinine, Ser: 0.77 mg/dL (ref 0.40–1.20)
GFR: 83.79 mL/min (ref >60.00)
GLUCOSE: 85 mg/dL (ref 70–99)
POTASSIUM: 4 meq/L (ref 3.5–5.1)
Sodium: 142 mEq/L (ref 135–145)
TOTAL PROTEIN: 7.3 g/dL (ref 6.0–8.3)
Total Bilirubin: 0.3 mg/dL (ref 0.2–1.2)

## 2017-04-04 ENCOUNTER — Other Ambulatory Visit: Payer: 59

## 2017-04-09 LAB — CELIAC PNL 2 RFLX ENDOMYSIAL AB TTR
ENDOMYSIAL AB IGA: NEGATIVE
GLIADIN(DEAM) AB,IGA: 12 U (ref <20)
GLIADIN(DEAM) AB,IGG: 8 U (ref <20)
IMMUNOGLOBULIN A: 223 mg/dL (ref 81–463)

## 2017-04-12 ENCOUNTER — Encounter: Payer: Self-pay | Admitting: Family Medicine

## 2017-04-12 DIAGNOSIS — R197 Diarrhea, unspecified: Secondary | ICD-10-CM

## 2017-04-12 DIAGNOSIS — R1013 Epigastric pain: Secondary | ICD-10-CM

## 2017-04-21 NOTE — Telephone Encounter (Addendum)
plz schedule GI appt in [place]. Thanks.  Pt needs in network provider.

## 2017-04-21 NOTE — Addendum Note (Signed)
Addended by: Eustaquio BoydenGUTIERREZ, Habib Kise on: 04/21/2017 11:36 AM   Modules accepted: Orders

## 2017-04-23 ENCOUNTER — Encounter (INDEPENDENT_AMBULATORY_CARE_PROVIDER_SITE_OTHER): Payer: 59

## 2017-04-23 ENCOUNTER — Ambulatory Visit (INDEPENDENT_AMBULATORY_CARE_PROVIDER_SITE_OTHER): Payer: 59 | Admitting: Vascular Surgery

## 2017-04-24 ENCOUNTER — Ambulatory Visit (INDEPENDENT_AMBULATORY_CARE_PROVIDER_SITE_OTHER): Payer: 59 | Admitting: Gastroenterology

## 2017-04-24 VITALS — BP 130/81 | HR 97 | Temp 98.6°F | Ht 66.0 in | Wt 172.6 lb

## 2017-04-24 DIAGNOSIS — K580 Irritable bowel syndrome with diarrhea: Secondary | ICD-10-CM | POA: Diagnosis not present

## 2017-04-24 MED ORDER — AMITRIPTYLINE HCL 25 MG PO TABS: 50.0000 mg | ORAL_TABLET | Freq: Every day | ORAL | 1 refills | Status: DC

## 2017-04-24 MED ORDER — RIFAXIMIN 550 MG PO TABS: 550.0000 mg | ORAL_TABLET | Freq: Three times a day (TID) | ORAL | 0 refills | Status: AC

## 2017-04-24 MED ORDER — METRONIDAZOLE 500 MG PO TABS: 500.0000 mg | ORAL_TABLET | Freq: Two times a day (BID) | ORAL | 0 refills | Status: DC

## 2017-04-24 NOTE — Patient Instructions (Addendum)
1. Start protonix 40mg  30min before breakfast 2. Start amitriptyline 25mg  at bedtime, then increase to 50mg  in 1week  3. Start rifaximin 550mg  TID 4. EGD to evaluate dyspepsia  Please call our office to speak with my nurse Iva LentoMichelle Ortega at [phone] during business hours from 8am to 4pm if you have any questions/concerns. During after hours, you will be redirected to on call GI physician. For any emergency please call 911 or go the nearest emergency room.    Arlyss Repressohini R Abrahm Mancia, MD [address]  [address]  [place], [place] [address]  Main: [phone]  Fax: [phone]

## 2017-04-24 NOTE — Progress Notes (Addendum)
Arlyss Repress, MD [address]  [address]  [place], [place] [address]  Main: [phone]  Fax: [phone]    Gastroenterology Consultation  Referring Provider:     Eustaquio Boyden, MD Primary Care Physician:  Eustaquio Boyden, MD Primary Gastroenterologist:  Dr. Arlyss Repress Reason for Consultation:     Diarrhea        HPI:   Kjerstin Abrigo is a 51 y.o. y/o female referred by Dr. Eustaquio Boyden, MD  for consultation & management of diarrhea  About 6-8 months h/o loose, frequent, stools, non bloody, bad odor, a/w bloating, especially post prandial No significant abdominal pain Stress aggravates her symptoms No f/c/n/v Also, has heart burn, took protonix  for 3weeks, felt better. No longer on it Can't tolerate coffee, and certain foods Denies weight loss, concerned about weight gain No evidence of anemia Work up so far has been negative for H Pylori stool Ag, celiac disease, C Diff, GI pathogen panel Denies NSAIDs use, ETOH, tobacco, IVDA Denies fam h/o GI malignancy GI Procedures: Colonoscopy 01/18/16, Normal  Past Medical History:  Diagnosis Date  . Abnormal Pap smear of cervix    s/p LEEP then hysterectomy  . Endometriosis   . Hyperlipidemia   . Lactose intolerance in adult     Past Surgical History:  Procedure Laterality Date  . ABDOMINAL HYSTERECTOMY  2004   abnormal pap, ovaries remain [facility])  . COLONOSCOPY WITH PROPOFOL N/A 01/18/2016   WNL per patient (Seeplaputhur Wynona Luna, MD)  . REDUCTION MAMMAPLASTY Bilateral 2008  . REFRACTIVE SURGERY  1994  . TENOSYNOVECTOMY Right 08/18/2016   Procedure: TENOSYNOVECTOMY;  Surgeon: Bradly Bienenstock, MD;  Location: [facility] OR;  Service: Orthopedics;  Laterality: Right;  . TENOSYNOVECTOMY Left 05/2016   Melvyn Novas    Prior to Admission medications   Medication Sig Start Date End Date Taking? Authorizing Provider  cholecalciferol (VITAMIN D) 1000 units tablet Take 1,000 Units by mouth daily.    Yes [provider]  estradiol (ESTRACE VAGINAL) 0.1 MG/GM vaginal cream Place 1 Applicatorful vaginally 2 (two) times a week. 04/01/17  Yes Nadara Mustard, MD  Lactobacillus (PROBIOTIC ACIDOPHILUS PO) Take 1 capsule by mouth daily.   Yes [provider]  Multiple Vitamin (MULTIVITAMIN) tablet Take 1 tablet by mouth daily.   Yes [provider]  Omega-3 Fatty Acids (FISH OIL) 1000 MG CAPS Take 2 capsules (2,000 mg total) by mouth daily. 11/15/16  Yes Eustaquio Boyden, MD  pantoprazole (PROTONIX) 40 MG tablet Take 1 tablet (40 mg total) by mouth daily. Patient not taking: Reported on 04/24/2017 03/07/17   Eustaquio Boyden, MD    Family History  Problem Relation Age of Onset  . Cancer Maternal Aunt 65       colon  . Cancer Maternal Uncle        liver  . Cancer Maternal Grandmother        uterine  . Cancer Maternal Grandfather        liver  . CAD Paternal Grandmother   . Cancer Maternal Uncle        throat  . CAD Paternal Uncle   . Diabetes Neg Hx      Social History  Substance Use Topics  . Smoking status: Never Smoker  . Smokeless tobacco: Never Used  . Alcohol use 0.6 - 1.2 oz/week    1 - 2 Standard drinks or equivalent per week     Comment: occasional    Allergies  as of 04/24/2017 - Review Complete 04/24/2017  Allergen Reaction Noted  . Amoxicillin Diarrhea 11/15/2016    Review of Systems:    All systems reviewed and negative except where noted in HPI.   Physical Exam:  BP 130/81   Pulse 97   Temp 98.6 F (37 C) (Oral)   Ht 5\' 6"  (1.676 m)   Wt 172 lb 9.6 oz (78.3 kg)   BMI 27.86 kg/m  No LMP recorded. Patient has had a hysterectomy.  General:   Alert,  Well-developed, well-nourished, pleasant and cooperative in NAD Head:  Normocephalic and atraumatic. Eyes:  Sclera clear, no icterus.   Conjunctiva pink. Ears:  Normal auditory acuity. Nose:  No deformity, discharge, or lesions. Mouth:  No deformity or lesions,oropharynx pink &  moist. Neck:  Supple; no masses or thyromegaly. Lungs:  Respirations even and unlabored.  Clear throughout to auscultation.   No wheezes, crackles, or rhonchi. No acute distress. Heart:  Regular rate and rhythm; no murmurs, clicks, rubs, or gallops. Abdomen:  Normal bowel sounds.  No bruits.  Soft, mild epigastric tenderness and non-distended without masses, hepatosplenomegaly or hernias noted.  No guarding or rebound tenderness.   Rectal: Nor performed Msk:  Symmetrical without gross deformities. Good, equal movement & strength bilaterally. Pulses:  Normal pulses noted. Extremities:  No clubbing or edema.  No cyanosis. Neurologic:  Alert and oriented x3;  grossly normal neurologically. Skin:  Intact without significant lesions or rashes. No jaundice. Lymph Nodes:  No significant cervical adenopathy. Psych:  Alert and cooperative. Normal mood and affect.  Imaging Studies: None  Assessment and Plan:   Ia L Tabares Ross LudwigDe Cardenas is a 51 y.o. y/o female with Chronic history of postprandial diarrhea, bloating, mild abdominal discomfort with no constitutional symptoms. H. pylori antigen negative, celiac serologies negative, no evidence of iron deficiency anemia, no NSAID history. She does have mild epigastric tenderness. Her symptoms are highly suggestive of IBS diarrhea.  1. Start protonix 40mg  daily 30min before breakfast 2. Start amitriptyline 25mg  at bedtime, then increase to 50mg  in 1week  3. Start rifaximin 550mg  TID for IBS diarrhea 4. EGD to evaluate dyspepsia  Follow up in 2 months   Arlyss Repressohini R Lliam Hoh, MD

## 2017-04-25 ENCOUNTER — Other Ambulatory Visit: Payer: Self-pay

## 2017-04-25 DIAGNOSIS — K219 Gastro-esophageal reflux disease without esophagitis: Secondary | ICD-10-CM

## 2017-05-29 ENCOUNTER — Encounter: Payer: Self-pay | Admitting: Gastroenterology

## 2017-05-30 ENCOUNTER — Encounter: Admission: RE | Payer: Self-pay | Source: Ambulatory Visit

## 2017-05-30 ENCOUNTER — Ambulatory Visit: Admission: RE | Admit: 2017-05-30 | Payer: 59 | Source: Ambulatory Visit | Admitting: Gastroenterology

## 2017-05-30 SURGERY — ESOPHAGOGASTRODUODENOSCOPY (EGD) WITH PROPOFOL
Anesthesia: General

## 2017-07-08 ENCOUNTER — Encounter: Payer: Self-pay | Admitting: Family Medicine

## 2017-07-12 ENCOUNTER — Ambulatory Visit: Payer: 59 | Admitting: Family Medicine

## 2017-07-12 ENCOUNTER — Encounter: Payer: Self-pay | Admitting: Family Medicine

## 2017-07-12 VITALS — BP 116/68 | HR 75 | Temp 97.9°F | Wt 167.0 lb

## 2017-07-12 DIAGNOSIS — E782 Mixed hyperlipidemia: Secondary | ICD-10-CM

## 2017-07-12 DIAGNOSIS — R0789 Other chest pain: Secondary | ICD-10-CM

## 2017-07-12 DIAGNOSIS — K580 Irritable bowel syndrome with diarrhea: Secondary | ICD-10-CM

## 2017-07-12 DIAGNOSIS — K219 Gastro-esophageal reflux disease without esophagitis: Secondary | ICD-10-CM

## 2017-07-12 MED ORDER — PANTOPRAZOLE SODIUM 40 MG PO TBEC: 40.0000 mg | DELAYED_RELEASE_TABLET | Freq: Every day | ORAL | 3 refills | Status: DC

## 2017-07-12 NOTE — Progress Notes (Signed)
BP 116/68 (BP Location: Left Arm, Patient Position: Sitting, Cuff Size: Normal)   Pulse 75   Temp 97.9 F (36.6 C) (Oral)   Wt 167 lb (75.8 kg)   SpO2 95%   BMI 26.95 kg/m    CC: chest discomfort Subjective:    Patient ID: Megan Hayes, female    DOB: 06/17/1966, 51 y.o.   MRN: [id number]  HPI: Megan Hayes is a 51 y.o. female presenting on 07/12/2017 for Chest Pain (pressure pain in left chest and dyspnea after exercise for about 1 mo. Feels strong heartbeat in neck. Has also felt tingling in left hand.)   Endorses few month history of intermittent L arm paresthesia associated with exertional dyspnea associated with L chest discomfort with radiation into neck described as substernal chest tightness. Chest discomfort not exertional, can come on at rest. Dyspnea can be exertional. Denies nausea, fever. Hasn't tried anything for this. Last episode was last week.   GERD - improved on PPI but then she ran out. Saw GI for loose stools - sxs highly suggestive of IBS-D, started on amitriptyline but pt has not taken - concern over side effects. She was also prescribed rifaximin 550mg  tid - unsure why not tried. EGD was recommended but pt did not undergo this. Has found which foods trigger GERD and tries to avoid.   No personal or fmhx h/o blood clots.  Fmhx CAD - grandmother and paternal uncle x2. No immediate family hx premature CAD.  Normal colonoscopy 2017  Relevant past medical, surgical, family and social history reviewed and updated as indicated. Interim medical history since our last visit reviewed. Allergies and medications reviewed and updated. Outpatient Medications Prior to Visit  Medication Sig Dispense Refill  . cholecalciferol (VITAMIN D) 1000 units tablet Take 1,000 Units by mouth daily.    . Lactobacillus (PROBIOTIC ACIDOPHILUS PO) Take 1 capsule by mouth daily.    . Multiple Vitamin (MULTIVITAMIN) tablet Take 1 tablet by mouth daily.    . Omega-3  Fatty Acids (FISH OIL) 1000 MG CAPS Take 2 capsules (2,000 mg total) by mouth daily.    Marland Kitchen. estradiol (ESTRACE VAGINAL) 0.1 MG/GM vaginal cream Place 1 Applicatorful vaginally 2 (two) times a week. 42.5 g 3  . pantoprazole (PROTONIX) 40 MG tablet Take 1 tablet (40 mg total) by mouth daily. 30 tablet 1  . amitriptyline (ELAVIL) 25 MG tablet Take 2 tablets (50 mg total) by mouth at bedtime. 60 tablet 1   No facility-administered medications prior to visit.      Per HPI unless specifically indicated in ROS section below Review of Systems     Objective:    BP 116/68 (BP Location: Left Arm, Patient Position: Sitting, Cuff Size: Normal)   Pulse 75   Temp 97.9 F (36.6 C) (Oral)   Wt 167 lb (75.8 kg)   SpO2 95%   BMI 26.95 kg/m   Wt Readings from Last 3 Encounters:  07/12/17 167 lb (75.8 kg)  04/24/17 172 lb 9.6 oz (78.3 kg)  03/29/17 170 lb (77.1 kg)    Physical Exam  Constitutional: She appears well-developed and well-nourished. No distress.  HENT:  Mouth/Throat: Oropharynx is clear and moist. No oropharyngeal exudate.  Cardiovascular: Normal rate, regular rhythm, normal heart sounds and intact distal pulses.  No murmur heard. Pulmonary/Chest: Effort normal and breath sounds normal. No respiratory distress. She has no wheezes. She has no rales.  Musculoskeletal: She exhibits no edema.  Skin: Skin is warm  and dry.  Psychiatric: She has a normal mood and affect.  Nursing note and vitals reviewed.  Lab Results  Component Value Date   CHOL 243 (H) 02/07/2017   HDL 49.80 02/07/2017   LDLDIRECT 150.0 02/07/2017   TRIG 222.0 (H) 02/07/2017   CHOLHDL 5 02/07/2017   No results found for: TSH     EKG - NSR rate 75, normal axis, intervals, no acute ST/T changes.  Assessment & Plan:   Problem List Items Addressed This Visit    Chest discomfort - Primary    New episodes of intermittent atypical chest pain (substernal, dyspnea associated with exertion). No fmhx, low risk patient.    Check EKG today - reassuring. Reviewed latest normal labs. Check TFTs, update FLP (when patient returns fasting).  This does not sound cardiac in etiology. I did suggest she start protonix regularly along with trial of TCA and update us with symptoms after a few weeks of regular PPI. If ongoing or worsening, would consider cardiology referral to definitively help r/o cardiac cause.       Relevant Orders   Lipid panel   TSH   T4, free   EKG 12-Lead (Completed)   GERD (gastroesophageal reflux disease)    rec restart protonix - refilled.       Relevant Medications   pantoprazole (PROTONIX) 40 MG tablet   Hyperlipidemia    Chronic, not on statin. On fish oil.  The 10-year ASCVD risk score Denman George(Goff DC Montez HagemanJr., et al., 2013) is: 1.6%   Values used to calculate the score:     Age: [age] years     Sex: Female     Is Non-Hispanic African American: No     Diabetic: No     Tobacco smoker: No     Systolic Blood Pressure: 116 mmHg     Is BP treated: No     HDL Cholesterol: 49.8 mg/dL     Total Cholesterol: 243 mg/dL       IBS (irritable bowel syndrome)    Reviewed recent GI eval highly suggestive of IBS-D. I did recommend she trial amitriptyline 25mg  as prescribed. Reviewed side effects of concern.       Relevant Medications   pantoprazole (PROTONIX) 40 MG tablet       Follow up plan: No Follow-up on file.  Eustaquio BoydenJavier Brondon Wann, MD

## 2017-07-12 NOTE — Patient Instructions (Addendum)
EKG today  Return fasting for labs.  comienze amitriptylina y pantoprazole diario para reflujo/acidez.

## 2017-07-14 DIAGNOSIS — R0789 Other chest pain: Secondary | ICD-10-CM | POA: Insufficient documentation

## 2017-07-14 NOTE — Assessment & Plan Note (Addendum)
Reviewed recent GI eval highly suggestive of IBS-D. I did recommend she trial amitriptyline 25mg  as prescribed. Reviewed side effects of concern.

## 2017-07-14 NOTE — Assessment & Plan Note (Addendum)
New episodes of intermittent atypical chest pain (substernal, dyspnea associated with exertion). No fmhx, low risk patient.  Check EKG today - reassuring. Reviewed latest normal labs. Check TFTs, update FLP (when patient returns fasting).  This does not sound cardiac in etiology. I did suggest she start protonix regularly along with trial of TCA and update us with symptoms after a few weeks of regular PPI. If ongoing or worsening, would consider cardiology referral to definitively help r/o cardiac cause.

## 2017-07-14 NOTE — Assessment & Plan Note (Signed)
rec restart protonix - refilled.

## 2017-07-14 NOTE — Assessment & Plan Note (Signed)
Chronic, not on statin. On fish oil.  The 10-year ASCVD risk score Denman George(Goff DC Montez HagemanJr., et al., 2013) is: 1.6%   Values used to calculate the score:     Age: 89 years     Sex: Female     Is Non-Hispanic African American: No     Diabetic: No     Tobacco smoker: No     Systolic Blood Pressure: 116 mmHg     Is BP treated: No     HDL Cholesterol: 49.8 mg/dL     Total Cholesterol: 243 mg/dL

## 2017-07-17 ENCOUNTER — Other Ambulatory Visit (INDEPENDENT_AMBULATORY_CARE_PROVIDER_SITE_OTHER): Payer: 59

## 2017-07-17 DIAGNOSIS — R0789 Other chest pain: Secondary | ICD-10-CM

## 2017-07-17 LAB — LIPID PANEL
CHOLESTEROL: 222 mg/dL — AB (ref 0–200)
HDL: 54.2 mg/dL (ref >39.00)
LDL Cholesterol: 131 mg/dL — ABNORMAL HIGH (ref 0–99)
NonHDL: 167.36
Total CHOL/HDL Ratio: 4
Triglycerides: 182 mg/dL — ABNORMAL HIGH (ref 0.0–149.0)
VLDL: 36.4 mg/dL (ref 0.0–40.0)

## 2017-07-17 LAB — TSH: TSH: 3.74 u[IU]/mL (ref 0.35–4.50)

## 2017-07-17 LAB — T4, FREE: Free T4: 0.74 ng/dL (ref 0.60–1.60)

## 2017-07-18 ENCOUNTER — Encounter: Payer: Self-pay | Admitting: Family Medicine

## 2017-08-07 ENCOUNTER — Other Ambulatory Visit: Payer: Self-pay

## 2017-08-07 DIAGNOSIS — K580 Irritable bowel syndrome with diarrhea: Secondary | ICD-10-CM

## 2017-08-07 MED ORDER — AMITRIPTYLINE HCL 25 MG PO TABS: 50.0000 mg | ORAL_TABLET | Freq: Every day | ORAL | 1 refills | Status: DC

## 2017-11-14 ENCOUNTER — Other Ambulatory Visit: Payer: Self-pay | Admitting: Family Medicine

## 2017-11-14 DIAGNOSIS — E782 Mixed hyperlipidemia: Secondary | ICD-10-CM

## 2017-11-15 ENCOUNTER — Other Ambulatory Visit: Payer: Self-pay

## 2017-11-18 ENCOUNTER — Encounter: Payer: Self-pay | Admitting: Family Medicine

## 2018-01-16 ENCOUNTER — Encounter: Payer: Self-pay | Admitting: Family Medicine

## 2018-01-16 ENCOUNTER — Ambulatory Visit (INDEPENDENT_AMBULATORY_CARE_PROVIDER_SITE_OTHER): Payer: BLUE CROSS/BLUE SHIELD | Admitting: Family Medicine

## 2018-01-16 VITALS — BP 120/80 | HR 79 | Temp 98.9°F | Ht 64.5 in | Wt 170.8 lb

## 2018-01-16 DIAGNOSIS — E739 Lactose intolerance, unspecified: Secondary | ICD-10-CM

## 2018-01-16 DIAGNOSIS — K219 Gastro-esophageal reflux disease without esophagitis: Secondary | ICD-10-CM | POA: Diagnosis not present

## 2018-01-16 DIAGNOSIS — Z0000 Encounter for general adult medical examination without abnormal findings: Secondary | ICD-10-CM

## 2018-01-16 DIAGNOSIS — H6980 Other specified disorders of Eustachian tube, unspecified ear: Secondary | ICD-10-CM | POA: Insufficient documentation

## 2018-01-16 DIAGNOSIS — R5383 Other fatigue: Secondary | ICD-10-CM

## 2018-01-16 DIAGNOSIS — E785 Hyperlipidemia, unspecified: Secondary | ICD-10-CM

## 2018-01-16 DIAGNOSIS — H938X1 Other specified disorders of right ear: Secondary | ICD-10-CM | POA: Diagnosis not present

## 2018-01-16 DIAGNOSIS — K580 Irritable bowel syndrome with diarrhea: Secondary | ICD-10-CM | POA: Diagnosis not present

## 2018-01-16 MED ORDER — FLUTICASONE PROPIONATE 50 MCG/ACT NA SUSP: 2.0000 | Freq: Every day | NASAL | 1 refills | Status: DC

## 2018-01-16 NOTE — Assessment & Plan Note (Addendum)
Off PPI °

## 2018-01-16 NOTE — Assessment & Plan Note (Signed)
With retracted TM suggesting pressure changes - rec start flonase daily x 2 wks and monitor for effect.

## 2018-01-16 NOTE — Patient Instructions (Addendum)
Revise con seguro si cubre Tdap (tetano y [place]).  Laboratorio hoy.  Empieze flonase nasal  Gusto verla hoy, llamenos con preguntas.   [place] (Health Maintenance, Female) Un estilo de vida saludable y los cuidados preventivos pueden favorecer considerablemente a la salud y Musician. Pregunte a su mdico cul es el cronograma de exmenes peridicos apropiado para usted. Esta es una buena oportunidad para consultarlo sobre cmo prevenir enfermedades y [place] sano. Adems de los controles, hay muchas otras cosas que puede hacer usted mismo. Los expertos han realizado numerosas investigaciones [employer] cambios en el estilo de vida y las medidas de prevencin que, [place], lo ayudarn a mantenerse sano. Solicite a su mdico ms informacin. EL PESO Y LA DIETA Consuma una dieta saludable.  Asegrese de [employer] verduras, frutas, productos lcteos de bajo contenido de [place] y Advertising account planner.  No consuma muchos alimentos de alto contenido de grasas slidas, azcares agregados o sal.  Realice actividad fsica con regularidad. Esta es una de las prcticas ms importantes que puede hacer por su salud. ? La Delorise Shiner de los adultos deben hacer ejercicio durante al menos 155mnutos por semana. El ejercicio debe aumentar la frecuencia cardaca y pActorla transpiracin (ejercicio de i[place]. ? La mayora de los adultos tambin deben hacer ejercicios de elongacin al mToysRusveces a la semana. Agregue esto al su plan de ejercicio de intensidad moderada. Mantenga un peso saludable.  El ndice de masa corporal ([facility] es una medida que puede utilizarse para identificar posibles problemas de pRevere Proporciona una estimacin de la grasa corporal basndose en el peso y la altura. Su mdico puede ayudarle a dRadiation protection practitionerIToky a lScientist, forensico mTheatre managerun peso saludable.  Para las mujeres de 20aos o ms: ? Un ISt Michaels Surgery Centermenor de 18,5 se considera  bajo peso. ? Un INorthern Plains Surgery Center LLCentre 18,5 y 24,9 es normal. ? Un IKindred Hospital - White Rockentre 25 y 29,9 se considera sobrepeso. ? Un IMC de 30 o ms se considera obesidad. Observe los niveles de colesterol y lpidos en la sangre.  Debe comenzar a rEnglish as a second language teacherde lpidos y cResearch officer, trade unionen la sangre a los 20aos y luego repetirlos cada 552aos  Es posible que nAutomotive engineerlos niveles de colesterol con mayor frecuencia si: ? Sus niveles de lpidos y colesterol son altos. ? Es mayor de [id number] ? Presenta un alto riesgo de padecer enfermedades cardacas. DETECCIN DE CNCER Cncer de pulmn  Se recomienda realizar exmenes de deteccin de cncer de pulmn a personas adultas entre 578y 867aos que estn en riesgo de dHorticulturist, commercialde pulmn por sus antecedentes de consumo de tabaco.  Se recomienda una tomografa computarizada de baja dosis de los pulmones todos los aos a las personas que: ? Fuman actualmente. ? Hayan dejado el hbito en algn momento en los ltimos 15aos. ? Hayan fumado durante 30aos un paquete diario. Un paquete-ao equivale a fumar un promedio de un paquete de cigarrillos diario durante un ao.  Los exmenes de deteccin anuales deben continuar hasta que hayan pasado 15aos desde que dej de fumar.  Ya no debern realizarse si tiene un problema de salud que le impida recibir tratamiento para eScience writerde pulmn. Cncer de mama  Practique la autoconciencia de la mama. Esto significa reconocer la apariencia normal de sus mamas y cmo las siente.  Tambin significa realizar autoexmenes regulares de lJohnson & Johnson Informe a su mdico sobre cualquier cambio, sin importar cun pequeo sea.  Si  tiene entre 20 y 13 aos, un mdico debe realizarle un examen clnico de las mamas como parte del examen regular de [place], cada 1 a 3aos.  Si tiene 40aos o ms, debe Information systems manager clnico de las Microsoft. Tambin considere realizarse una [place])  todos los [place].  Si tiene antecedentes familiares de cncer de mama, hable con su mdico para someterse a un estudio gentico.  Si tiene alto riesgo de Chief Financial Officer de mama, hable con su mdico para someterse a Public house manager y [employer].  La evaluacin del gen del cncer de mama (BRCA) se recomienda a mujeres que tengan familiares con cnceres relacionados con el BRCA. Los cnceres relacionados con el BRCA incluyen los siguientes: ? Bode. ? Ovario. ? Trompas. ? Cnceres de peritoneo.  Los resultados de la evaluacin determinarn la necesidad de asesoramiento gentico y de [place] de BRCA1 y BRCA2. Cncer de cuello del tero El mdico puede recomendarle que se haga pruebas peridicas de deteccin de cncer de los rganos de la pelvis (ovarios, tero y vagina). Estas pruebas incluyen un examen plvico, que abarca controlar si se produjeron cambios microscpicos en la superficie del cuello del tero (prueba de Papanicolaou). Pueden recomendarle que se haga estas pruebas cada 3aos, a partir de los 21aos.  A las mujeres que tienen entre 30 y 38aos, los mdicos pueden recomendarles que se sometan a exmenes plvicos y pruebas de Papanicolaou cada 72aos, o a la prueba de Papanicolaou y el examen plvico en combinacin con estudios de deteccin del virus del papiloma humano (VPH) cada 5aos. Algunos tipos de VPH aumentan el riesgo de Chief Financial Officer de cuello del tero. La prueba para la deteccin del VPH tambin puede realizarse a mujeres de cualquier edad cuyos resultados de la prueba de Papanicolaou no sean claros.  Es posible que otros mdicos no recomienden exmenes de deteccin a mujeres no embarazadas que se consideran sujetos de bajo riesgo de Chief Financial Officer de pelvis y que no tienen sntomas. Pregntele al mdico si un examen plvico de deteccin es adecuado para usted.  Si ha recibido un tratamiento para Science writer cervical o una enfermedad que podra  causar cncer, necesitar realizarse una prueba de Papanicolaou y controles durante al menos 78 aos de concluido el [place]. Si no se ha hecho el Papanicolaou con regularidad, debern volver a evaluarse los factores de riesgo (como tener un nuevo compaero sexual), para Teacher, adult education si debe realizarse los estudios nuevamente. Algunas mujeres sufren problemas mdicos que aumentan la probabilidad de Museum/gallery curator cncer de cuello del tero. En estos casos, el mdico podr [employer] se realicen controles y pruebas de Papanicolaou con ms frecuencia. Cncer colorrectal  Este tipo de cncer puede detectarse y a menudo prevenirse.  Por lo general, los estudios de rutina se deben Medical laboratory scientific officer a Field seismologist a Proofreader de los 57 aos y [place] 55 aos.  Sin embargo, el mdico podr aconsejarle que lo haga antes, si tiene factores de riesgo para el cncer de colon.  Tambin puede recomendarle que use un kit de prueba para Hydrologist en la materia fecal.  Es posible que se use una pequea cmara en el extremo de un tubo para examinar directamente el colon (sigmoidoscopia o colonoscopia) a fin de Hydrographic surveyor formas tempranas de cncer colorrectal.  Los exmenes de rutina generalmente comienzan a los 57aos.  El examen directo del colon se debe repetir cada 5 a 10aos hasta los 75aos. Sin embargo, es  posible que se realicen exmenes con mayor frecuencia, si se detectan formas tempranas de plipos precancerosos o pequeos bultos. Cncer de piel  Revise la piel de la cabeza a los pies con regularidad.  Informe a su mdico si aparecen nuevos lunares o los que tiene se modifican, especialmente en su forma y color.  Tambin notifique al mdico si tiene un lunar que es ms grande que el tamao de una goma de lpiz.  Siempre use pantalla solar. Aplique pantalla solar de Kerry Dory y repetida a lo largo del Training and development officer.  Protjase usando mangas y [employer], un sombrero de ala ancha y gafas para el sol,  siempre que se encuentre en el exterior. ENFERMEDADES CARDACAS, DIABETES E HIPERTENSIN ARTERIAL  La hipertensin arterial causa enfermedades cardacas y [place] el riesgo de ictus. La hipertensin arterial es ms probable en los siguientes casos: ? Las personas que tienen la presin arterial en el extremo del rango normal (100-139/85-89 mm Hg). ? [employer] con sobrepeso u obesidad. ? Scientist, water quality.  Si usted tiene entre 18 y 39 aos, debe medirse la presin arterial cada 3 a 5 aos. Si usted tiene 40 aos o ms, debe medirse la presin arterial [employer]. Debe medirse la presin arterial dos veces: una vez cuando est en un hospital o una clnica y la otra vez cuando est en otro sitio. Registre el promedio de [employer]. Para controlar su presin arterial cuando no est en un hospital o [place], puede usar lo siguiente: ? Jorje Guild automtica para medir la presin arterial en una farmacia. ? Un monitor para medir la presin arterial en el hogar.  Si tiene entre 38 y 91 aos, consulte a su mdico si debe tomar aspirina para prevenir el ictus.  Realcese exmenes de deteccin de la diabetes con regularidad. Esto incluye la toma de [place] de sangre para controlar el nivel de azcar en la sangre durante el [place]. ? Si tiene un peso normal y un bajo riesgo de padecer diabetes, realcese este anlisis cada tres aos despus de los 45aos. ? Si tiene sobrepeso y un alto riesgo de padecer diabetes, considere someterse a este anlisis antes o con mayor frecuencia. PREVENCIN DE INFECCIONES HepatitisB  Si tiene un riesgo ms alto de Museum/gallery curator hepatitis B, debe someterse a un examen de deteccin de este virus. Se considera que tiene un alto riesgo de contraer hepatitis B si: ? Naci en un pas donde la hepatitis B es frecuente. Pregntele a su mdico qu pases son considerados de Public affairs consultant. ? Sus padres nacieron en un pas de alto riesgo y usted no recibi  una vacuna que lo proteja contra la hepatitis B (vacuna contra la hepatitis B). ? [place]. ? [place] agujas para inyectarse drogas. ? Vive con alguien que tiene hepatitis B. ? Ha tenido sexo con alguien que tiene hepatitis B. ? Recibe tratamiento de hemodilisis. ? Toma ciertos medicamentos para el cncer, trasplante de rganos y afecciones autoinmunitarias. Hepatitis C  Se recomienda un anlisis de [place] para: ? [employer] 1945 y 1965. ? Todas las personas que tengan un riesgo de haber contrado hepatitis C. Enfermedades de transmisin sexual (ETS).  Debe realizarse pruebas de deteccin de enfermedades de transmisin sexual (ETS), incluidas gonorrea y clamidia si: ? Es sexualmente activo y es menor de 76LYY. ? Es mayor de 24aos, y Investment banker, operational informa que corre riesgo de tener este tipo de infecciones. ? Calla Kicks sexual  ha cambiado desde que le hicieron la ltima prueba de deteccin y tiene un riesgo mayor de Best boy clamidia o Radio broadcast assistant. Pregntele al mdico si usted tiene riesgo.  Si no tiene el VIH, pero corre riesgo de infectarse por el virus, se recomienda tomar diariamente un medicamento recetado para evitar la infeccin. Esto se conoce como profilaxis previa a la exposicin. Se considera que est en riesgo si: ? Es [place] sexualmente y no [place] preservativos habitualmente o no conoce el estado del VIH de sus Advertising copywriter. ? Se inyecta drogas. ? Es [place] sexualmente con Ardelia Mems pareja que tiene VIH. Consulte a su mdico para saber si tiene un alto riesgo de infectarse por el VIH. Si opta por comenzar la profilaxis previa a la exposicin, primero debe realizarse anlisis de deteccin del VIH. Luego, le harn anlisis cada 33mses mientras est tomando los medicamentos para la profilaxis previa a la exposicin. EKindred [facility] - [facility] Ana Si es premenopusica y puede quedar eEast [place] solicite a su mdico asesoramiento previo a la concepcin.  Si puede quedar embarazada,  tome 400 a [id number](mcg) de cido fAnheuser-Busch  Si desea evitar el embarazo, hable con su mdico sobre el control de la natalidad (anticoncepcin). OSTEOPOROSIS Y MENOPAUSIA  La osteoporosis es una enfermedad en la que los huesos pierden los minerales y la fuerza por el avance de la edad. El resultado pueden ser fracturas graves en los hAnthony El riesgo de osteoporosis puede identificarse con uArdelia Memsprueba de densidad sea.  Si tiene 65aos o ms, o si est en riesgo de sufrir osteoporosis y fracturas, pregunte a su mdico si debe someterse a exmenes.  Consulte a su mdico si debe tomar un suplemento de calcio o de vitamina D para reducir el riesgo de osteoporosis.  La menopausia puede presentar ciertos sntomas fsicos y rGaffer  La terapia de reemplazo hormonal puede reducir algunos de estos sntomas y rGaffer Consulte a su mdico para saber si la terapia de reemplazo hormonal es conveniente para usted. INSTRUCCIONES PARA EL CUIDADO EN EL HOGAR  Realcese los estudios de rutina de la salud, dentales y de lPublic librarian  MSacred Heart  No consuma ningn producto que contenga tabaco, lo que incluye cigarrillos, tabaco de mHigher education careers advisero cPsychologist, sport and exercise  Si est embarazada, no beba alcohol.  Si est amamantando, reduzca el consumo de alcohol y la frecuencia con la que consume.  Si es mujer y no est embarazada limite el consumo de alcohol a no ms de 1 medida por da. Una medida equivale a 12onzas de cerveza, 5onzas de vino o 1onzas de bebidas alcohlicas de alta graduacin.  No consuma drogas.  No comparta agujas.  Solicite ayuda a su mdico si necesita apoyo o informacin para abandonar las drogas.  Informe a su mdico si a menudo se siente deprimido.  Notifique a su mdico si alguna vez ha sido vctima de abuso o si no se siente seguro en su hogar. Esta informacin no tiene cMarine scientistel consejo del mdico. Asegrese de  hacerle al mdico cualquier pregunta que tenga. Document Released: 07/19/2011 Document Revised: 08/20/2014 Document Reviewed: 05/03/2015 Elsevier Interactive Patient Education  2[employer]

## 2018-01-16 NOTE — Assessment & Plan Note (Signed)
Preventative protocols reviewed and updated unless pt declined. Discussed healthy diet and lifestyle.  

## 2018-01-16 NOTE — Progress Notes (Signed)
BP 120/80 (BP Location: Left Arm, Patient Position: Sitting, Cuff Size: Normal)   Pulse 79   Temp 98.9 F (37.2 C) (Oral)   Ht 5' 4.5" (1.638 m)   Wt 170 lb 12 oz (77.5 kg)   SpO2 95%   BMI 28.86 kg/m    CC: CPE Subjective:    Patient ID: Megan Hayes, female    DOB: 06/07/1966, 52 y.o.   MRN: [id number]  HPI: Megan Hayes is a 52 y.o. female presenting on 01/16/2018 for Annual Exam   New job - changed insurance to high deductible plan.   Preventative: COLONOSCOPY WITH PROPOFOL 01/18/2016 WNL (Seeplaputhur Wynona LunaG Sankar, MD) Lung cancer screening - not eligible Mammogram birads1 10/2016 Well woman exam - partial hysterectomy 2004 for abnormal pap smear and heavy bleeding (endometriosis), ovaries remain - she had pelvic US normal 102018 in [place].  DEXA scan - not due yet Flu shot -yearly Tetanus shot - Tdap today Pneumonia shot - not due Shingrix - not due Advanced directive discussion -  Seat belt use discussed Sunscreen use discussed. No changing moles on skin.  Smoking -  Alcohol  -   Dentist - Q6 mo Eye exam - yearly  From [place] Lives with BF, 2 dogs 2 children  Occ: marketing  Edu: bachelor's  Activity: hiking once weekly  Diet: good water, fruits/vegetables daily   Relevant past medical, surgical, family and social history reviewed and updated as indicated. Interim medical history since our last visit reviewed. Allergies and medications reviewed and updated. Outpatient Medications Prior to Visit  Medication Sig Dispense Refill  . cholecalciferol (VITAMIN D) 1000 units tablet Take 1,000 Units by mouth daily.    . Lactobacillus (PROBIOTIC ACIDOPHILUS PO) Take 1 capsule by mouth daily.    . Multiple Vitamin (MULTIVITAMIN) tablet Take 1 tablet by mouth daily.    . Omega-3 Fatty Acids (FISH OIL) 1000 MG CAPS Take 2 capsules (2,000 mg total) by mouth daily.    Marland Kitchen. amitriptyline (ELAVIL) 25 MG tablet Take 2 tablets (50 mg total) by  mouth at bedtime. 60 tablet 1  . pantoprazole (PROTONIX) 40 MG tablet Take 1 tablet (40 mg total) by mouth daily. 30 tablet 3   No facility-administered medications prior to visit.      Per HPI unless specifically indicated in ROS section below Review of Systems  Constitutional: Negative for activity change, appetite change, chills, fatigue, fever and unexpected weight change.  HENT: Positive for congestion. Negative for hearing loss.   Eyes: Negative for visual disturbance.  Respiratory: Negative for cough, chest tightness, shortness of breath and wheezing.   Cardiovascular: Negative for chest pain, palpitations and leg swelling.  Gastrointestinal: Positive for abdominal pain and diarrhea (with lactose). Negative for abdominal distention, blood in stool, constipation, nausea and vomiting.       Lactose intolerant Stress irritates bowels GERD with greasy foods  Genitourinary: Negative for difficulty urinating and hematuria.  Musculoskeletal: Negative for arthralgias, myalgias and neck pain.  Skin: Negative for rash.  Neurological: Positive for headaches (?allergy related). Negative for dizziness, seizures and syncope.  Hematological: Negative for adenopathy. Does not bruise/bleed easily.  Psychiatric/Behavioral: Negative for dysphoric mood. The patient is not nervous/anxious.        Objective:    BP 120/80 (BP Location: Left Arm, Patient Position: Sitting, Cuff Size: Normal)   Pulse 79   Temp 98.9 F (37.2 C) (Oral)   Ht 5' 4.5" (1.638 m)   Wt 170  lb 12 oz (77.5 kg)   SpO2 95%   BMI 28.86 kg/m   Wt Readings from Last 3 Encounters:  01/16/18 170 lb 12 oz (77.5 kg)  07/12/17 167 lb (75.8 kg)  04/24/17 172 lb 9.6 oz (78.3 kg)    Physical Exam  Constitutional: She is oriented to person, place, and time. She appears well-developed and well-nourished. No distress.  HENT:  Head: Normocephalic and atraumatic.  Right Ear: Hearing, external ear and ear canal normal.  Left Ear:  Hearing, tympanic membrane, external ear and ear canal normal.  Nose: Nose normal.  Mouth/Throat: Uvula is midline, oropharynx is clear and moist and mucous membranes are normal. No oropharyngeal exudate, posterior oropharyngeal edema or posterior oropharyngeal erythema.  Retracted TM on right  Eyes: Pupils are equal, round, and reactive to light. Conjunctivae and EOM are normal. No scleral icterus.  Neck: Normal range of motion. Neck supple.  Cardiovascular: Normal rate, regular rhythm, normal heart sounds and intact distal pulses.  No murmur heard. Pulses:      Radial pulses are 2+ on the right side, and 2+ on the left side.  Pulmonary/Chest: Effort normal and breath sounds normal. No respiratory distress. She has no wheezes. She has no rales.  Abdominal: Soft. Bowel sounds are normal. She exhibits no distension and no mass. There is no tenderness. There is no rebound and no guarding.  Musculoskeletal: Normal range of motion. She exhibits no edema.  Lymphadenopathy:    She has no cervical adenopathy.  Neurological: She is alert and oriented to person, place, and time.  CN grossly intact, station and gait intact  Skin: Skin is warm and dry. No rash noted.  Psychiatric: She has a normal mood and affect. Her behavior is normal. Judgment and thought content normal.  Nursing note and vitals reviewed.  Results for orders placed or performed in visit on 07/17/17  T4, free  Result Value Ref Range   Free T4 0.74 0.60 - 1.60 ng/dL  TSH  Result Value Ref Range   TSH 3.74 0.35 - 4.50 uIU/mL  Lipid panel  Result Value Ref Range   Cholesterol 222 (H) 0 - 200 mg/dL   Triglycerides 161.0 (H) 0.0 - 149.0 mg/dL   HDL 96.04 >54.09 mg/dL   VLDL 81.1 0.0 - 91.4 mg/dL   LDL Cholesterol 782 (H) 0 - 99 mg/dL   Total CHOL/HDL Ratio 4    NonHDL 167.36       Assessment & Plan:   Problem List Items Addressed This Visit    Ear fullness, right    With retracted TM suggesting pressure changes - rec  start flonase daily x 2 wks and monitor for effect.       GERD (gastroesophageal reflux disease)    Off PPI.       Health maintenance examination - Primary    Preventative protocols reviewed and updated unless pt declined. Discussed healthy diet and lifestyle.       Hyperlipidemia    Chronic, will return fasting for rpt labs.       Relevant Orders   Lipid panel   Basic metabolic panel   IBS (irritable bowel syndrome)    GI eval suggestive of IBS-D. Not taking TCA.       Lactose intolerance in adult    Other Visit Diagnoses    Fatigue, unspecified type       Relevant Orders   VITAMIN D 25 Hydroxy (Vit-D Deficiency, Fractures)       Meds ordered  this encounter  Medications  . fluticasone (FLONASE) 50 MCG/ACT nasal spray    Sig: Place 2 sprays into both nostrils daily.    Dispense:  16 g    Refill:  1   Orders Placed This Encounter  Procedures  . VITAMIN D 25 Hydroxy (Vit-D Deficiency, Fractures)    Standing Status:   Future    Standing Expiration Date:   01/17/2019  . Lipid panel    Standing Status:   Future    Standing Expiration Date:   01/17/2019  . Basic metabolic panel    Standing Status:   Future    Standing Expiration Date:   01/17/2019    Follow up plan: Return in about 1 year (around 01/17/2019) for annual exam, prior fasting for blood work.  Eustaquio Boyden, MD

## 2018-01-16 NOTE — Assessment & Plan Note (Signed)
Chronic, will return fasting for rpt labs.

## 2018-01-16 NOTE — Assessment & Plan Note (Signed)
GI eval suggestive of IBS-D. Not taking TCA.

## 2018-01-27 ENCOUNTER — Other Ambulatory Visit (INDEPENDENT_AMBULATORY_CARE_PROVIDER_SITE_OTHER): Payer: BLUE CROSS/BLUE SHIELD

## 2018-01-27 DIAGNOSIS — Z0000 Encounter for general adult medical examination without abnormal findings: Secondary | ICD-10-CM

## 2018-01-27 DIAGNOSIS — E785 Hyperlipidemia, unspecified: Secondary | ICD-10-CM

## 2018-01-27 DIAGNOSIS — R5383 Other fatigue: Secondary | ICD-10-CM

## 2018-01-27 LAB — BASIC METABOLIC PANEL
BUN: 15 mg/dL (ref 6–23)
CO2: 29 meq/L (ref 19–32)
CREATININE: 0.7 mg/dL (ref 0.40–1.20)
Calcium: 9.5 mg/dL (ref 8.4–10.5)
Chloride: 105 mEq/L (ref 96–112)
GFR: 93.24 mL/min (ref >60.00)
Glucose, Bld: 92 mg/dL (ref 70–99)
Potassium: 3.9 mEq/L (ref 3.5–5.1)
Sodium: 141 mEq/L (ref 135–145)

## 2018-01-27 LAB — LIPID PANEL
CHOL/HDL RATIO: 5
CHOLESTEROL: 231 mg/dL — AB (ref 0–200)
HDL: 46 mg/dL (ref >39.00)
NONHDL: 184.94
TRIGLYCERIDES: 252 mg/dL — AB (ref 0.0–149.0)
VLDL: 50.4 mg/dL — ABNORMAL HIGH (ref 0.0–40.0)

## 2018-01-27 LAB — VITAMIN D 25 HYDROXY (VIT D DEFICIENCY, FRACTURES): VITD: 26.84 ng/mL — ABNORMAL LOW (ref 30.00–100.00)

## 2018-01-27 LAB — LDL CHOLESTEROL, DIRECT: LDL DIRECT: 140 mg/dL

## 2018-02-03 ENCOUNTER — Other Ambulatory Visit: Payer: Self-pay | Admitting: Family Medicine

## 2018-02-03 MED ORDER — VITAMIN D3 50 MCG (2000 UT) PO TABS: 2000.0000 [IU] | ORAL_TABLET | Freq: Every day | ORAL | Status: DC

## 2018-02-11 ENCOUNTER — Other Ambulatory Visit: Payer: Self-pay | Admitting: Family Medicine

## 2018-02-26 DIAGNOSIS — G8929 Other chronic pain: Secondary | ICD-10-CM | POA: Diagnosis not present

## 2018-02-26 DIAGNOSIS — M7731 Calcaneal spur, right foot: Secondary | ICD-10-CM | POA: Diagnosis not present

## 2018-02-26 DIAGNOSIS — M722 Plantar fascial fibromatosis: Secondary | ICD-10-CM | POA: Diagnosis not present

## 2018-02-26 DIAGNOSIS — M79671 Pain in right foot: Secondary | ICD-10-CM | POA: Diagnosis not present

## 2018-03-21 ENCOUNTER — Encounter: Payer: Self-pay | Admitting: Family Medicine

## 2018-03-21 DIAGNOSIS — E559 Vitamin D deficiency, unspecified: Secondary | ICD-10-CM | POA: Insufficient documentation

## 2018-04-16 ENCOUNTER — Encounter: Payer: Self-pay | Admitting: Family Medicine

## 2018-04-17 NOTE — Telephone Encounter (Signed)
Pt has questions about bill she got for her regular physcial back in 01/2018.  Can you forward to appropriate staff to help answer her questions? Thanks

## 2018-06-16 ENCOUNTER — Encounter: Payer: Self-pay | Admitting: Obstetrics & Gynecology

## 2018-06-16 ENCOUNTER — Ambulatory Visit (INDEPENDENT_AMBULATORY_CARE_PROVIDER_SITE_OTHER): Payer: BLUE CROSS/BLUE SHIELD | Admitting: Obstetrics & Gynecology

## 2018-06-16 VITALS — BP 112/70 | Ht 66.0 in | Wt 169.0 lb

## 2018-06-16 DIAGNOSIS — Z0000 Encounter for general adult medical examination without abnormal findings: Secondary | ICD-10-CM

## 2018-06-16 DIAGNOSIS — Z23 Encounter for immunization: Secondary | ICD-10-CM

## 2018-06-16 DIAGNOSIS — Z01419 Encounter for gynecological examination (general) (routine) without abnormal findings: Secondary | ICD-10-CM | POA: Diagnosis not present

## 2018-06-16 DIAGNOSIS — Z1239 Encounter for other screening for malignant neoplasm of breast: Secondary | ICD-10-CM

## 2018-06-16 NOTE — Patient Instructions (Signed)
PAP every 5 years Mammogram every year    Call [phone] to schedule at [facility] Colonoscopy every 10 years Labs yearly (with PCP) 

## 2018-06-16 NOTE — Progress Notes (Signed)
HPI:      Megan Hayes is a 52 y.o. [id number] who LMP was in the past, she presents today for her annual examination.  The patient has no complaints today. The patient is sexually active. Herlast pap: approximate date 2016 and was normal and last mammogram: was normal and approximate date 2018 and was normal.  The patient does perform self breast exams.  There is no notable family history of breast or ovarian cancer in her family. The patient is not currently taking hormone replacement therapy. Patient denies post-menopausal vaginal bleeding.  She does have monthly cyclical brown like discharge, no itch or odor.  No vag dryness or dyspareunia. The patient has regular exercise: yes. The patient denies current symptoms of depression.    GYN Hx: Last Colonoscopy:1 year ago. Normal.   PMHx: Past Medical History:  Diagnosis Date  . Abnormal Pap smear of cervix    s/p LEEP then hysterectomy  . Endometriosis   . Hyperlipidemia   . Lactose intolerance in adult    Past Surgical History:  Procedure Laterality Date  . ABDOMINAL HYSTERECTOMY  2004   abnormal pap, ovaries remain [facility])  . COLONOSCOPY WITH PROPOFOL N/A 01/18/2016   WNL per patient (Seeplaputhur Wynona Luna, MD)  . REDUCTION MAMMAPLASTY Bilateral 2008  . REFRACTIVE SURGERY  1994  . TENOSYNOVECTOMY Right 08/18/2016   Procedure: TENOSYNOVECTOMY;  Surgeon: Bradly Bienenstock, MD;  [facility];  Service: Orthopedics;  Laterality: Right;  . TENOSYNOVECTOMY Left 05/2016   Melvyn Novas   Family History  Problem Relation Age of Onset  . Cancer Maternal Aunt 65       colon  . Cancer Maternal Uncle        liver  . Cancer Maternal Grandmother        uterine  . Cancer Maternal Grandfather        liver  . CAD Paternal Grandmother   . Cancer Maternal Uncle        throat  . CAD Paternal Uncle   . Diabetes Neg Hx    Social History   Tobacco Use  . Smoking status: Never Smoker  . Smokeless tobacco: Never Used  Substance  Use Topics  . Alcohol use: Yes    Alcohol/week: 1.0 - 2.0 standard drinks    Types: 1 - 2 Standard drinks or equivalent per week    Comment: occasional  . Drug use: No    Current Outpatient Medications:  .  cholecalciferol 2000 units TABS, Take 2,000 Units by mouth daily., Disp: , Rfl:  .  fluticasone (FLONASE) 50 MCG/ACT nasal spray, Place 2 sprays into both nostrils daily., Disp: 16 g, Rfl: 1 .  Lactobacillus (PROBIOTIC ACIDOPHILUS PO), Take 1 capsule by mouth daily., Disp: , Rfl:  .  Multiple Vitamin (MULTIVITAMIN) tablet, Take 1 tablet by mouth daily., Disp: , Rfl:  .  Omega-3 Fatty Acids (FISH OIL) 1000 MG CAPS, Take 2 capsules (2,000 mg total) by mouth daily., Disp: , Rfl:  Allergies: Amoxicillin  Review of Systems  Constitutional: Negative for chills, fever and malaise/fatigue.  HENT: Negative for congestion, sinus pain and sore throat.   Eyes: Negative for blurred vision and pain.  Respiratory: Negative for cough and wheezing.   Cardiovascular: Negative for chest pain and leg swelling.  Gastrointestinal: Negative for abdominal pain, constipation, diarrhea, heartburn, nausea and vomiting.  Genitourinary: Negative for dysuria, frequency, hematuria and urgency.  Musculoskeletal: Negative for back pain, joint pain, myalgias and neck pain.  Skin:  Negative for itching and rash.  Neurological: Negative for dizziness, tremors and weakness.  Endo/Heme/Allergies: Does not bruise/bleed easily.  Psychiatric/Behavioral: Negative for depression. The patient is not nervous/anxious and does not have insomnia.     Objective: BP 112/70   Ht 5\' 6"  (1.676 m)   Wt 169 lb (76.7 kg)   BMI 27.28 kg/m   Filed Weights   06/16/18 1451  Weight: 169 lb (76.7 kg)   Body mass index is 27.28 kg/m. Physical Exam  Constitutional: She is oriented to person, place, and time. She appears well-developed and well-nourished. No distress.  Genitourinary: Rectum normal and vagina normal. Pelvic exam was  performed with patient supine. There is no rash or lesion on the right labia. There is no rash or lesion on the left labia. Vagina exhibits no lesion. No bleeding in the vagina. Right adnexum does not display mass and does not display tenderness. Left adnexum does not display mass and does not display tenderness.  Genitourinary Comments: Absent Uterus Absent cervix Vaginal cuff well healed  HENT:  Head: Normocephalic and atraumatic. Head is without laceration.  Right Ear: Hearing normal.  Left Ear: Hearing normal.  Nose: No epistaxis.  No foreign bodies.  Mouth/Throat: Uvula is midline, oropharynx is clear and moist and mucous membranes are normal.  Eyes: Pupils are equal, round, and reactive to light.  Neck: Normal range of motion. Neck supple. No thyromegaly present.  Cardiovascular: Normal rate and regular rhythm. Exam reveals no gallop and no friction rub.  No murmur heard. Pulmonary/Chest: Effort normal and breath sounds normal. No respiratory distress. She has no wheezes. Right breast exhibits no mass, no skin change and no tenderness. Left breast exhibits no mass, no skin change and no tenderness.  Abdominal: Soft. Bowel sounds are normal. She exhibits no distension. There is no tenderness. There is no rebound.  Musculoskeletal: Normal range of motion.  Neurological: She is alert and oriented to person, place, and time. No cranial nerve deficit.  Skin: Skin is warm and dry.  Psychiatric: She has a normal mood and affect. Judgment normal.  Vitals reviewed.  Assessment: Annual Exam 1. Annual physical exam   2. Screening for breast cancer    Plan:            1.  Cervical Screening-  Pap smear schedule reviewed with patient  2. Breast screening- Exam annually and mammogram scheduled  3. Colonoscopy every 10 years, Hemoccult testing after age 48  4. Labs managed by PCP  5. Counseling for hormonal therapy: none  6. Cyclical vaginal discharge, nothing on exam of concern.   Monitor.  Hormones for menopause may or may not help and she has no other sx's to warrant HRT.     F/U  Return in about 1 year (around 06/17/2019) for Annual.  Annamarie Major, MD, [facility], [facility] 06/16/2018  3:15 PM

## 2018-07-09 ENCOUNTER — Ambulatory Visit
Admission: RE | Admit: 2018-07-09 | Discharge: 2018-07-09 | Disposition: A | Discharge status: Home / Self Care | Payer: BLUE CROSS/BLUE SHIELD | Source: Ambulatory Visit | Attending: Obstetrics & Gynecology | Admitting: Obstetrics & Gynecology

## 2018-07-09 DIAGNOSIS — Z1239 Encounter for other screening for malignant neoplasm of breast: Secondary | ICD-10-CM | POA: Diagnosis not present

## 2018-07-09 DIAGNOSIS — Z1231 Encounter for screening mammogram for malignant neoplasm of breast: Secondary | ICD-10-CM | POA: Diagnosis not present

## 2018-08-29 ENCOUNTER — Other Ambulatory Visit: Payer: Self-pay | Admitting: Family Medicine

## 2018-08-29 MED ORDER — FLUTICASONE PROPIONATE 50 MCG/ACT NA SUSP: 2.0000 | Freq: Every day | NASAL | 1 refills | Status: DC

## 2018-08-29 NOTE — Telephone Encounter (Signed)
E-scribed refills.  

## 2018-09-04 DIAGNOSIS — M722 Plantar fascial fibromatosis: Secondary | ICD-10-CM | POA: Diagnosis not present

## 2018-09-23 ENCOUNTER — Other Ambulatory Visit: Payer: Self-pay | Admitting: Family Medicine

## 2018-10-22 ENCOUNTER — Other Ambulatory Visit: Payer: Self-pay | Admitting: Family Medicine

## 2018-10-27 DIAGNOSIS — M722 Plantar fascial fibromatosis: Secondary | ICD-10-CM | POA: Diagnosis not present

## 2018-11-06 ENCOUNTER — Encounter: Payer: Self-pay | Admitting: Family Medicine

## 2018-11-07 ENCOUNTER — Telehealth: Payer: Self-pay

## 2018-11-07 ENCOUNTER — Ambulatory Visit (INDEPENDENT_AMBULATORY_CARE_PROVIDER_SITE_OTHER): Payer: BLUE CROSS/BLUE SHIELD | Admitting: Family Medicine

## 2018-11-07 ENCOUNTER — Other Ambulatory Visit: Payer: Self-pay

## 2018-11-07 ENCOUNTER — Telehealth: Payer: BLUE CROSS/BLUE SHIELD | Admitting: Family Medicine

## 2018-11-07 ENCOUNTER — Encounter: Payer: Self-pay | Admitting: Family Medicine

## 2018-11-07 VITALS — BP 121/72 | HR 76 | Temp 98.5°F | Ht 64.5 in

## 2018-11-07 DIAGNOSIS — H938X1 Other specified disorders of right ear: Secondary | ICD-10-CM

## 2018-11-07 DIAGNOSIS — K219 Gastro-esophageal reflux disease without esophagitis: Secondary | ICD-10-CM

## 2018-11-07 DIAGNOSIS — J302 Other seasonal allergic rhinitis: Secondary | ICD-10-CM

## 2018-11-07 DIAGNOSIS — K140 Glossitis: Secondary | ICD-10-CM | POA: Diagnosis not present

## 2018-11-07 DIAGNOSIS — E739 Lactose intolerance, unspecified: Secondary | ICD-10-CM

## 2018-11-07 DIAGNOSIS — E559 Vitamin D deficiency, unspecified: Secondary | ICD-10-CM

## 2018-11-07 MED ORDER — NYSTATIN 100000 UNIT/ML MT SUSP: 5.0000 mL | Freq: Four times a day (QID) | OROMUCOSAL | 0 refills | Status: DC

## 2018-11-07 NOTE — Assessment & Plan Note (Addendum)
Ongoing concern. Previous eval with retracted TM and ETD but without significant improvement after flonase course. rec continue flonase, will refer to ENT for further evaluation non urgently.

## 2018-11-07 NOTE — Progress Notes (Signed)
Virtual visit completed through WebEx.  Patient location: home Provider location: [place] [facility], office If any vitals were documented below, they were collected by patient at home.    BP 121/72 (BP Location: Left Arm, Patient Position: Sitting, Cuff Size: Normal)   Pulse 76   Temp 98.5 F (36.9 C) (Oral)   Ht 5' 4.5" (1.638 m)   BMI 28.56 kg/m    CC: tongue swelling, R ear pressure  Subjective:    Patient ID: Megan Hayes, female    DOB: 1965/11/29, 53 y.o.   MRN: [id number]  HPI: Ritchie Jansky is a 53 y.o. female presenting on 11/07/2018 for Oral Swelling (C/o tongue swelling and redness, off and on for last 2 mos. Thinks it could be related to something she's eating, or maybe reflux. Tried sleeping a little elevated. ) and Ear Problem (C/o right ear pressure. Not sure if allergy related or issue with tongue. )   Over the past 1-2 months, noting swelling of tongue as well as some intermittent throat swelling (swelling of palate). Burning of tongue. Increased sensitivity to spicy foods, acidic foods like lemons. Dry mouth, ?dry eyes. Denies fevers/chills, new rash or significant joint pains.   She has been treating with peroxide gargles and aloe gargles with benefit.  Notes swelling of ankles.  Increasing intolerance to eggs over the past month - pruritis when she eats eggs. Benadryl helped.  Previous trouble with milk intolerance and spicy foods.   H/o R ear fullness 01/2018 thought related to allergies - off and on benefit.  H/o GERD, has not been using PPI regularly - more PRN.   Ongoing R ear fullness/pressure for over a year - without pain or drainage. Flonase helps some - has been taking regularly for the past month.      Relevant past medical, surgical, family and social history reviewed and updated as indicated. Interim medical history since our last visit reviewed. Allergies and medications reviewed and updated. Outpatient Medications  Prior to Visit  Medication Sig Dispense Refill  . Cholecalciferol (VITAMIN D3) 125 MCG (5000 UT) CAPS Take 1 capsule by mouth daily.    . fluticasone (FLONASE) 50 MCG/ACT nasal spray SPRAY 2 SPRAYS INTO EACH NOSTRIL EVERY DAY 16 g 1  . Lactobacillus (PROBIOTIC ACIDOPHILUS PO) Take 1 capsule by mouth daily.    . pantoprazole (PROTONIX) 40 MG tablet TAKE 1 TABLET BY MOUTH EVERY DAY (Patient taking differently: Take 40 mg by mouth daily. As needed) 90 tablet 1  . vitamin C (ASCORBIC ACID) 500 MG tablet Take 500 mg by mouth daily.    . cholecalciferol 2000 units TABS Take 2,000 Units by mouth daily.    . Multiple Vitamin (MULTIVITAMIN) tablet Take 1 tablet by mouth daily.    . Omega-3 Fatty Acids (FISH OIL) 1000 MG CAPS Take 2 capsules (2,000 mg total) by mouth daily.     No facility-administered medications prior to visit.      Per HPI unless specifically indicated in ROS section below Review of Systems Objective:    BP 121/72 (BP Location: Left Arm, Patient Position: Sitting, Cuff Size: Normal)   Pulse 76   Temp 98.5 F (36.9 C) (Oral)   Ht 5' 4.5" (1.638 m)   BMI 28.56 kg/m   Wt Readings from Last 3 Encounters:  06/16/18 169 lb (76.7 kg)  01/16/18 170 lb 12 oz (77.5 kg)  07/12/17 167 lb (75.8 kg)    Physical Exam Vitals signs  and nursing note reviewed.  Constitutional:      Appearance: Normal appearance. She is not ill-appearing.  HENT:     Mouth/Throat:     Mouth: Mucous membranes are moist.     Comments: Tongue not inflamed. No white film present on tongue today.  Pulmonary:     Comments: Speaks in complete sentences without increased work of breathing Neurological:     Mental Status: She is alert.   Reviewed photo from mychart message dated 11/06/2018 with evident smooth and enlarged tongue as well as erythema in central groove of tongue    Results for orders placed or performed in visit on 01/27/18  Basic metabolic panel  Result Value Ref Range   Sodium 141 135 - 145  mEq/L   Potassium 3.9 3.5 - 5.1 mEq/L   Chloride 105 96 - 112 mEq/L   CO2 29 19 - 32 mEq/L   Glucose, Bld 92 70 - 99 mg/dL   BUN 15 6 - 23 mg/dL   Creatinine, Ser 5.80 0.40 - 1.20 mg/dL   Calcium 9.5 8.4 - 99.8 mg/dL   GFR 33.82 >50.53 mL/min  Lipid panel  Result Value Ref Range   Cholesterol 231 (H) 0 - 200 mg/dL   Triglycerides 976.7 (H) 0.0 - 149.0 mg/dL   HDL 34.19 >37.90 mg/dL   VLDL 24.0 (H) 0.0 - 97.3 mg/dL   Total CHOL/HDL Ratio 5    NonHDL 184.94   VITAMIN D 25 Hydroxy (Vit-D Deficiency, Fractures)  Result Value Ref Range   VITD 26.84 (L) 30.00 - 100.00 ng/mL  LDL cholesterol, direct  Result Value Ref Range   Direct LDL 140.0 mg/dL   Assessment & Plan:   Problem List Items Addressed This Visit    Vitamin D deficiency   Relevant Orders   VITAMIN D 25 Hydroxy (Vit-D Deficiency, Fractures)   Seasonal allergic rhinitis    H/o this - rec continue flonase while she awaits ENT eval for chronic R ear fullness.       Lactose intolerance in adult   Glossitis - Primary    Relatively new in the past 1-2 months of unclear cause - will further evaluate with labs (r/o nutritional deficiency, autoimmune condition like sjogren's or celiac disease). Will start PPI for possible GERD, will Rx nystatin swish/swallow for possible candidal cause. Pt agrees with plan.       Relevant Orders   Vitamin B12   Ferritin   Comprehensive metabolic panel   CBC with Differential/Platelet   Tissue Transglutaminase, IGA   Folate   GERD (gastroesophageal reflux disease)    H/o this - rec retrial PPI in case contributing to throat symptoms.       Ear fullness, right    Ongoing concern. Previous eval with retracted TM and ETD but without significant improvement after flonase course. rec continue flonase, will refer to ENT for further evaluation non urgently.        Relevant Orders   Ambulatory referral to ENT       Meds ordered this encounter  Medications  . nystatin (MYCOSTATIN)  100000 UNIT/ML suspension    Sig: Take 5 mLs (500,000 Units total) by mouth 4 (four) times daily.    Dispense:  120 mL    Refill:  0   Orders Placed This Encounter  Procedures  . VITAMIN D 25 Hydroxy (Vit-D Deficiency, Fractures)    Standing Status:   Future    Standing Expiration Date:   11/09/2019  . Vitamin B12  Standing Status:   Future    Standing Expiration Date:   11/09/2019  . Ferritin    Standing Status:   Future    Standing Expiration Date:   11/09/2019  . Comprehensive metabolic panel    Standing Status:   Future    Standing Expiration Date:   11/09/2019  . CBC with Differential/Platelet    Standing Status:   Future    Standing Expiration Date:   11/09/2019  . Tissue Transglutaminase, IGA    Standing Status:   Future    Standing Expiration Date:   11/09/2019  . Folate    Standing Status:   Future    Standing Expiration Date:   11/09/2019  . Ambulatory referral to ENT    Referral Priority:   Routine    Referral Type:   Consultation    Referral Reason:   Specialty Services Required    Requested Specialty:   Otolaryngology    Number of Visits Requested:   1    Follow up plan: No follow-ups on file.  Eustaquio Boyden, MD

## 2018-11-07 NOTE — Telephone Encounter (Signed)
Seen today. 

## 2018-11-07 NOTE — Telephone Encounter (Signed)
Received IBC from patient reporting concerns with red, swollen tongue and increasing pressure in right ear.   Patient states she may be having an allergic reaction to something she ate but is not sure. Started Allegra approx. 1 mth ago.   Patient is able to complete Webex. Appt scheduled 11/07/18 @ 1530.

## 2018-11-07 NOTE — Telephone Encounter (Signed)
Noted. Will see then. Thanks.

## 2018-11-09 NOTE — Assessment & Plan Note (Signed)
H/o this - rec retrial PPI in case contributing to throat symptoms.

## 2018-11-09 NOTE — Assessment & Plan Note (Signed)
Relatively new in the past 1-2 months of unclear cause - will further evaluate with labs (r/o nutritional deficiency, autoimmune condition like sjogren's or celiac disease). Will start PPI for possible GERD, will Rx nystatin swish/swallow for possible candidal cause. Pt agrees with plan.

## 2018-11-09 NOTE — Assessment & Plan Note (Signed)
H/o this - rec continue flonase while she awaits ENT eval for chronic R ear fullness.

## 2018-11-10 ENCOUNTER — Other Ambulatory Visit: Payer: Self-pay

## 2018-11-10 ENCOUNTER — Other Ambulatory Visit (INDEPENDENT_AMBULATORY_CARE_PROVIDER_SITE_OTHER): Payer: BLUE CROSS/BLUE SHIELD

## 2018-11-10 DIAGNOSIS — K140 Glossitis: Secondary | ICD-10-CM | POA: Diagnosis not present

## 2018-11-10 DIAGNOSIS — E559 Vitamin D deficiency, unspecified: Secondary | ICD-10-CM | POA: Diagnosis not present

## 2018-11-11 LAB — CBC WITH DIFFERENTIAL/PLATELET
BASOS PCT: 0.9 % (ref 0.0–3.0)
Basophils Absolute: 0.1 10*3/uL (ref 0.0–0.1)
Eosinophils Absolute: 0.2 10*3/uL (ref 0.0–0.7)
Eosinophils Relative: 2.8 % (ref 0.0–5.0)
HEMATOCRIT: 39 % (ref 36.0–46.0)
Hemoglobin: 13 g/dL (ref 12.0–15.0)
LYMPHS ABS: 2.4 10*3/uL (ref 0.7–4.0)
LYMPHS PCT: 42.5 % (ref 12.0–46.0)
MCHC: 33.2 g/dL (ref 30.0–36.0)
MCV: 79.2 fl (ref 78.0–100.0)
MONOS PCT: 8.5 % (ref 3.0–12.0)
Monocytes Absolute: 0.5 10*3/uL (ref 0.1–1.0)
NEUTROS ABS: 2.5 10*3/uL (ref 1.4–7.7)
Neutrophils Relative %: 45.3 % (ref 43.0–77.0)
PLATELETS: 287 10*3/uL (ref 150.0–400.0)
RBC: 4.93 Mil/uL (ref 3.87–5.11)
RDW: 13.8 % (ref 11.5–15.5)
WBC: 5.6 10*3/uL (ref 4.0–10.5)

## 2018-11-11 LAB — FOLATE: Folate: 22.4 ng/mL (ref >5.9)

## 2018-11-11 LAB — COMPREHENSIVE METABOLIC PANEL
ALT: 20 U/L (ref 0–35)
AST: 20 U/L (ref 0–37)
Albumin: 4.2 g/dL (ref 3.5–5.2)
Alkaline Phosphatase: 71 U/L (ref 39–117)
BUN: 12 mg/dL (ref 6–23)
CALCIUM: 9.5 mg/dL (ref 8.4–10.5)
CO2: 27 meq/L (ref 19–32)
Chloride: 103 mEq/L (ref 96–112)
Creatinine, Ser: 0.8 mg/dL (ref 0.40–1.20)
GFR: 74.97 mL/min (ref >60.00)
GLUCOSE: 79 mg/dL (ref 70–99)
Potassium: 3.9 mEq/L (ref 3.5–5.1)
Sodium: 138 mEq/L (ref 135–145)
Total Bilirubin: 0.2 mg/dL (ref 0.2–1.2)
Total Protein: 7.3 g/dL (ref 6.0–8.3)

## 2018-11-11 LAB — TISSUE TRANSGLUTAMINASE, IGA: (tTG) Ab, IgA: 1 U/mL

## 2018-11-11 LAB — VITAMIN D 25 HYDROXY (VIT D DEFICIENCY, FRACTURES): VITD: 32.44 ng/mL (ref 30.00–100.00)

## 2018-11-11 LAB — FERRITIN: FERRITIN: 34.6 ng/mL (ref 10.0–291.0)

## 2018-11-11 LAB — VITAMIN B12: VITAMIN B 12: 262 pg/mL (ref 211–911)

## 2018-11-12 ENCOUNTER — Other Ambulatory Visit: Payer: Self-pay | Admitting: Family Medicine

## 2018-11-12 MED ORDER — B-12 1000 MCG SL SUBL: 1.0000 | SUBLINGUAL_TABLET | Freq: Every day | SUBLINGUAL | Status: AC

## 2018-11-24 ENCOUNTER — Encounter: Payer: Self-pay | Admitting: Family Medicine

## 2018-11-24 NOTE — Telephone Encounter (Addendum)
plz call pt to offer in office visit for ongoing shortness of breath possible allergic cause. Does not sound like covid19 but also plz do covid19 screening questions.  We may end up doing in office spirometry.

## 2018-11-25 NOTE — Telephone Encounter (Signed)
nvm pt declined office visit at this time. Thanks.

## 2018-11-26 NOTE — Telephone Encounter (Signed)
Noted  

## 2018-11-28 ENCOUNTER — Other Ambulatory Visit: Payer: Self-pay | Admitting: Family Medicine

## 2018-12-05 ENCOUNTER — Encounter: Payer: Self-pay | Admitting: Family Medicine

## 2018-12-08 ENCOUNTER — Encounter: Payer: Self-pay | Admitting: Family Medicine

## 2018-12-09 ENCOUNTER — Encounter: Payer: Self-pay | Admitting: Family Medicine

## 2018-12-09 DIAGNOSIS — K140 Glossitis: Secondary | ICD-10-CM

## 2018-12-09 DIAGNOSIS — R0600 Dyspnea, unspecified: Secondary | ICD-10-CM

## 2018-12-09 DIAGNOSIS — R220 Localized swelling, mass and lump, head: Secondary | ICD-10-CM

## 2018-12-09 NOTE — Telephone Encounter (Signed)
See other mychart message.

## 2018-12-09 NOTE — Telephone Encounter (Signed)
Spoke with patient.  Ongoing tongue swelling/glossitis. Feels this is allergic reaction to something but has been unable to find trigger. Also feels swelling in throat - taking loratadine.   Started taking B12 vitamin and multivitamin.  Started using humidifier at night time.   Also endorsing intermittent dyspnea both with exertion and at rest.   No h/o asthma.  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 Will refer to allergist for further evaluation for allergic reaction.

## 2018-12-22 ENCOUNTER — Other Ambulatory Visit: Payer: Self-pay | Admitting: Family Medicine

## 2018-12-25 DIAGNOSIS — R0602 Shortness of breath: Secondary | ICD-10-CM | POA: Diagnosis not present

## 2018-12-25 DIAGNOSIS — K219 Gastro-esophageal reflux disease without esophagitis: Secondary | ICD-10-CM | POA: Diagnosis not present

## 2018-12-25 DIAGNOSIS — J3089 Other allergic rhinitis: Secondary | ICD-10-CM | POA: Diagnosis not present

## 2018-12-31 ENCOUNTER — Encounter: Payer: Self-pay | Admitting: Family Medicine

## 2018-12-31 DIAGNOSIS — T783XXA Angioneurotic edema, initial encounter: Secondary | ICD-10-CM | POA: Insufficient documentation

## 2019-01-04 ENCOUNTER — Encounter: Payer: Self-pay | Admitting: Family Medicine

## 2019-01-04 DIAGNOSIS — K140 Glossitis: Secondary | ICD-10-CM

## 2019-01-04 DIAGNOSIS — K219 Gastro-esophageal reflux disease without esophagitis: Secondary | ICD-10-CM

## 2019-01-04 DIAGNOSIS — J302 Other seasonal allergic rhinitis: Secondary | ICD-10-CM

## 2019-01-08 ENCOUNTER — Other Ambulatory Visit: Payer: Self-pay | Admitting: Student

## 2019-01-08 DIAGNOSIS — R1319 Other dysphagia: Secondary | ICD-10-CM

## 2019-01-08 DIAGNOSIS — R198 Other specified symptoms and signs involving the digestive system and abdomen: Secondary | ICD-10-CM

## 2019-01-08 DIAGNOSIS — K219 Gastro-esophageal reflux disease without esophagitis: Secondary | ICD-10-CM

## 2019-01-08 DIAGNOSIS — R0989 Other specified symptoms and signs involving the circulatory and respiratory systems: Secondary | ICD-10-CM | POA: Diagnosis not present

## 2019-01-08 DIAGNOSIS — R1013 Epigastric pain: Secondary | ICD-10-CM | POA: Diagnosis not present

## 2019-01-08 DIAGNOSIS — R1310 Dysphagia, unspecified: Secondary | ICD-10-CM | POA: Diagnosis not present

## 2019-01-08 DIAGNOSIS — R1312 Dysphagia, oropharyngeal phase: Secondary | ICD-10-CM | POA: Diagnosis not present

## 2019-01-09 ENCOUNTER — Encounter: Payer: Self-pay | Admitting: Family Medicine

## 2019-01-09 ENCOUNTER — Telehealth: Payer: Self-pay | Admitting: Family Medicine

## 2019-01-09 DIAGNOSIS — H938X1 Other specified disorders of right ear: Secondary | ICD-10-CM

## 2019-01-09 NOTE — Telephone Encounter (Signed)
Spoke with [place]. She saw Dineen Kid 2 days ago at [facility]. They have ordered a Fluoroscopy for 01/16/19 and a [place] Swallow which hasnt been scheduled yet. She wants to wait on the ENT Referral until after she gets results from [facility]. She has my number to call me back and I told her I would followup with her in a couple of weeks also.I called [facility] asnd asked for their note but it wasn't signed yet, she said she would fax it over when it is complete.

## 2019-01-09 NOTE — Telephone Encounter (Signed)
Noted! Thank you

## 2019-01-09 NOTE — Assessment & Plan Note (Addendum)
Saw allergist [place]) 12/2018 - started on flonase, xyzal, singulair and proair inhaler with benefit. Allergy testing negative (skin prick and intradermal).

## 2019-01-09 NOTE — Assessment & Plan Note (Signed)
Allergist recommended continue PPI

## 2019-01-15 NOTE — Addendum Note (Signed)
Addended by: Eustaquio Boyden on: 01/15/2019 02:38 PM   Modules accepted: Orders

## 2019-01-16 ENCOUNTER — Other Ambulatory Visit: Payer: Self-pay | Admitting: Family Medicine

## 2019-01-16 ENCOUNTER — Other Ambulatory Visit: Payer: Self-pay

## 2019-01-16 ENCOUNTER — Ambulatory Visit
Admission: RE | Admit: 2019-01-16 | Discharge: 2019-01-16 | Disposition: A | Discharge status: Home / Self Care | Payer: BC Managed Care – PPO | Source: Ambulatory Visit | Attending: Student | Admitting: Student

## 2019-01-16 DIAGNOSIS — K219 Gastro-esophageal reflux disease without esophagitis: Secondary | ICD-10-CM | POA: Diagnosis not present

## 2019-01-16 DIAGNOSIS — R1310 Dysphagia, unspecified: Secondary | ICD-10-CM | POA: Diagnosis not present

## 2019-01-16 DIAGNOSIS — R0989 Other specified symptoms and signs involving the circulatory and respiratory systems: Secondary | ICD-10-CM | POA: Diagnosis not present

## 2019-01-16 DIAGNOSIS — R198 Other specified symptoms and signs involving the digestive system and abdomen: Secondary | ICD-10-CM

## 2019-01-16 DIAGNOSIS — R1319 Other dysphagia: Secondary | ICD-10-CM

## 2019-02-02 ENCOUNTER — Encounter: Payer: Self-pay | Admitting: Family Medicine

## 2019-02-02 DIAGNOSIS — K140 Glossitis: Secondary | ICD-10-CM | POA: Diagnosis not present

## 2019-02-02 DIAGNOSIS — J310 Chronic rhinitis: Secondary | ICD-10-CM | POA: Diagnosis not present

## 2019-02-02 DIAGNOSIS — H6980 Other specified disorders of Eustachian tube, unspecified ear: Secondary | ICD-10-CM | POA: Diagnosis not present

## 2019-02-02 DIAGNOSIS — R0981 Nasal congestion: Secondary | ICD-10-CM | POA: Diagnosis not present

## 2019-02-04 ENCOUNTER — Other Ambulatory Visit: Payer: Self-pay

## 2019-02-04 ENCOUNTER — Ambulatory Visit (INDEPENDENT_AMBULATORY_CARE_PROVIDER_SITE_OTHER): Payer: BC Managed Care – PPO | Admitting: Family Medicine

## 2019-02-04 ENCOUNTER — Encounter: Payer: Self-pay | Admitting: Family Medicine

## 2019-02-04 VITALS — BP 120/70 | HR 71 | Temp 98.0°F | Ht 64.75 in | Wt 171.2 lb

## 2019-02-04 DIAGNOSIS — Z0000 Encounter for general adult medical examination without abnormal findings: Secondary | ICD-10-CM

## 2019-02-04 DIAGNOSIS — K140 Glossitis: Secondary | ICD-10-CM

## 2019-02-04 DIAGNOSIS — E538 Deficiency of other specified B group vitamins: Secondary | ICD-10-CM | POA: Insufficient documentation

## 2019-02-04 DIAGNOSIS — K219 Gastro-esophageal reflux disease without esophagitis: Secondary | ICD-10-CM | POA: Diagnosis not present

## 2019-02-04 DIAGNOSIS — H6981 Other specified disorders of Eustachian tube, right ear: Secondary | ICD-10-CM

## 2019-02-04 DIAGNOSIS — Z23 Encounter for immunization: Secondary | ICD-10-CM

## 2019-02-04 DIAGNOSIS — E782 Mixed hyperlipidemia: Secondary | ICD-10-CM | POA: Diagnosis not present

## 2019-02-04 NOTE — Assessment & Plan Note (Signed)
Confirmed by barium swallow, pending MBS. On protonix and pepcid. Appreciate GI care. Pt asks about candidiasis. Encouraged f/u with GI.

## 2019-02-04 NOTE — Progress Notes (Signed)
This visit was conducted in person.  BP 120/70 (BP Location: Left Arm, Patient Position: Sitting, Cuff Size: Normal)   Pulse 71   Temp 98 F (36.7 C) (Temporal)   Ht 5' 4.75" (1.645 m)   Wt 171 lb 3 oz (77.7 kg)   SpO2 96%   BMI 28.71 kg/m    CC: CPE Subjective:    Patient ID: Megan Hayes, female    DOB: 04/21/1966, 53 y.o.   MRN: [id number]  HPI: Megan Hayes is a 53 y.o. female presenting on 02/04/2019 for Annual Exam   Has high deductible insurance plan.   See recent mychart messages for details.  Complicated recent history.  Saw ENT for R ear fullness and tongue swelling/burning pain, note reviewed: for chronic rhinitis and ETD rec azelastine and consider turbinate reduction. continue flonase. For glossitis with burning tongue - ?low B12 and ferritin, ?sjogren's with ESR, empirically trying [facility] and alpha lipoic acid. Tongue scrapings were sent for culture.  Saw allergist - testing largely negative.  Saw GI. Barium swallow showed mild GERD - was placed on pantoprazole 80m daily + famotidine 468mdaily. Ongoing reflux. Pending MBS  Preventative: COLONOSCOPY WITH PROPOFOL 01/18/2016 WNL (Seeplaputhur G Robinette HainesMD) normal per patient.  Lung cancer screening - not eligible  Mammogram birads1 06/2018  Well woman exam - partial hysterectomy 2004 for abnormal pap smear and heavy bleeding (endometriosis), ovaries remain - [id number] DEXA scan - not due yet Flu shot -yearly Tdap  Pneumonia shot - not due Shingrix - not due Seat belt use discussed Sunscreen use discussed. No changing moles on skin.  Non smoker Alcohol - rarely Dentist - Q6 mo Eye exam - yearly  [place], 2 dogs 2 children  Occ: marketing  Edu: bachelor's  Activity: hiking once weekly  Diet: good water, fruits/vegetables daily      Relevant past medical, surgical, family and  social history reviewed and updated as indicated. Interim medical history since our last visit reviewed. Allergies and medications reviewed and updated. Outpatient Medications Prior to Visit  Medication Sig Dispense Refill  . Albuterol Sulfate (PROAIR RESPICLICK) [id number] Base) MCG/ACT AEPB Inhale into the lungs every 6 (six) hours as needed for wheezing. 1-2 puffs every 4-6 hours as needed for cough/wheezing    . Alpha-Lipoic Acid 200 MG CAPS Take 1 capsule by mouth 3 (three) times daily.    . Cholecalciferol (VITAMIN D3) 125 MCG (5000 UT) CAPS Take 1 capsule by mouth daily.    . Cyanocobalamin (B-12) 1000 MCG SUBL Place 1 tablet under the tongue daily. 30 each   . famotidine (PEPCID) 40 MG tablet Take 1 tablet by mouth at bedtime.    . fluticasone (FLONASE) 50 MCG/ACT nasal spray SPRAY 2 SPRAYS INTO EACH NOSTRIL EVERY DAY 16 g 1  . Lactobacillus (PROBIOTIC ACIDOPHILUS PO) Take 1 capsule by mouth daily.    . Marland Kitchenevocetirizine (XYZAL) 5 MG tablet Take 1 tablet by mouth at bedtime.    . montelukast (SINGULAIR) 10 MG tablet Take 1 tablet by mouth at bedtime.    . pantoprazole (PROTONIX) 40 MG tablet TAKE 1 TABLET BY MOUTH EVERY DAY (Patient taking differently: Take 40 mg by mouth daily. As needed) 90 tablet 1  . vitamin C (ASCORBIC ACID) 500 MG tablet Take 500 mg by mouth daily.    . Marland Kitchenystatin (MYCOSTATIN) 100000 UNIT/ML suspension Take  5 mLs (500,000 Units total) by mouth 4 (four) times daily. 120 mL 0   No facility-administered medications prior to visit.      Per HPI unless specifically indicated in ROS section below Review of Systems  Constitutional: Negative for activity change, appetite change, chills, fatigue, fever and unexpected weight change.  HENT: Negative for hearing loss.   Eyes: Negative for visual disturbance.  Respiratory: Negative for cough, chest tightness, shortness of breath and wheezing.   Cardiovascular: Positive for palpitations. Negative for chest pain and leg swelling.   Gastrointestinal: Negative for abdominal distention, abdominal pain, blood in stool, constipation, diarrhea, nausea and vomiting.  Genitourinary: Negative for difficulty urinating and hematuria.  Musculoskeletal: Negative for arthralgias, myalgias and neck pain.  Skin: Negative for rash.  Neurological: Negative for dizziness, seizures, syncope and headaches.  Hematological: Negative for adenopathy. Does not bruise/bleed easily.  Psychiatric/Behavioral: Negative for dysphoric mood. The patient is not nervous/anxious.    Objective:    BP 120/70 (BP Location: Left Arm, Patient Position: Sitting, Cuff Size: Normal)   Pulse 71   Temp 98 F (36.7 C) (Temporal)   Ht 5' 4.75" (1.645 m)   Wt 171 lb 3 oz (77.7 kg)   SpO2 96%   BMI 28.71 kg/m   Wt Readings from Last 3 Encounters:  02/04/19 171 lb 3 oz (77.7 kg)  06/16/18 169 lb (76.7 kg)  01/16/18 170 lb 12 oz (77.5 kg)    Physical Exam Vitals signs and nursing note reviewed.  Constitutional:      General: She is not in acute distress.    Appearance: Normal appearance. She is well-developed.  HENT:     Head: Normocephalic and atraumatic.     Right Ear: Hearing, tympanic membrane, ear canal and external ear normal.     Left Ear: Hearing, tympanic membrane, ear canal and external ear normal.     Mouth/Throat:     Mouth: Mucous membranes are moist.     Pharynx: Uvula midline. No oropharyngeal exudate or posterior oropharyngeal erythema.  Eyes:     General: No scleral icterus.    Conjunctiva/sclera: Conjunctivae normal.     Pupils: Pupils are equal, round, and reactive to light.  Neck:     Musculoskeletal: Normal range of motion and neck supple.  Cardiovascular:     Rate and Rhythm: Normal rate and regular rhythm.     Pulses: Normal pulses.          Radial pulses are 2+ on the right side and 2+ on the left side.     Heart sounds: Normal heart sounds. No murmur.  Pulmonary:     Effort: Pulmonary effort is normal. No respiratory  distress.     Breath sounds: Normal breath sounds. No wheezing, rhonchi or rales.     Comments: Has some difficulty taking deep breaths  Abdominal:     General: Bowel sounds are normal. There is no distension.     Palpations: Abdomen is soft. There is no mass.     Tenderness: There is no abdominal tenderness. There is no guarding or rebound.  Musculoskeletal: Normal range of motion.  Lymphadenopathy:     Cervical: No cervical adenopathy.  Skin:    General: Skin is warm and dry.     Findings: No rash.  Neurological:     General: No focal deficit present.     Mental Status: She is alert and oriented to person, place, and time.     Comments: CN grossly intact, station and  gait intact  Psychiatric:        Mood and Affect: Mood normal.        Behavior: Behavior normal.        Thought Content: Thought content normal.        Judgment: Judgment normal.       Results for orders placed or performed in visit on 11/10/18  VITAMIN D 25 Hydroxy (Vit-D Deficiency, Fractures)  Result Value Ref Range   VITD 32.44 30.00 - 100.00 ng/mL  Vitamin B12  Result Value Ref Range   Vitamin B-12 262 211 - 911 pg/mL  Ferritin  Result Value Ref Range   Ferritin 34.6 10.0 - 291.0 ng/mL  Comprehensive metabolic panel  Result Value Ref Range   Sodium 138 135 - 145 mEq/L   Potassium 3.9 3.5 - 5.1 mEq/L   Chloride 103 96 - 112 mEq/L   CO2 27 19 - 32 mEq/L   Glucose, Bld 79 70 - 99 mg/dL   BUN 12 6 - 23 mg/dL   Creatinine, Ser 0.80 0.40 - 1.20 mg/dL   Total Bilirubin 0.2 0.2 - 1.2 mg/dL   Alkaline Phosphatase 71 39 - 117 U/L   AST 20 0 - 37 U/L   ALT 20 0 - 35 U/L   Total Protein 7.3 6.0 - 8.3 g/dL   Albumin 4.2 3.5 - 5.2 g/dL   Calcium 9.5 8.4 - 10.5 mg/dL   GFR 74.97 >60.00 mL/min  CBC with Differential/Platelet  Result Value Ref Range   WBC 5.6 4.0 - 10.5 K/uL   RBC 4.93 3.87 - 5.11 Mil/uL   Hemoglobin 13.0 12.0 - 15.0 g/dL   HCT 39.0 36.0 - 46.0 %   MCV 79.2 78.0 - 100.0 fl   MCHC 33.2  30.0 - 36.0 g/dL   RDW 13.8 11.5 - 15.5 %   Platelets 287.0 150.0 - 400.0 K/uL   Neutrophils Relative % 45.3 43.0 - 77.0 %   Lymphocytes Relative 42.5 12.0 - 46.0 %   Monocytes Relative 8.5 3.0 - 12.0 %   Eosinophils Relative 2.8 0.0 - 5.0 %   Basophils Relative 0.9 0.0 - 3.0 %   Neutro Abs 2.5 1.4 - 7.7 K/uL   Lymphs Abs 2.4 0.7 - 4.0 K/uL   Monocytes Absolute 0.5 0.1 - 1.0 K/uL   Eosinophils Absolute 0.2 0.0 - 0.7 K/uL   Basophils Absolute 0.1 0.0 - 0.1 K/uL  Tissue Transglutaminase, IGA  Result Value Ref Range   (tTG) Ab, IgA 1 U/mL  Folate  Result Value Ref Range   Folate 22.4 >5.9 ng/mL   Lab Results  Component Value Date   CHOL 231 (H) 01/27/2018   HDL 46.00 01/27/2018   LDLCALC 131 (H) 07/17/2017   LDLDIRECT 140.0 01/27/2018   TRIG 252.0 (H) 01/27/2018   CHOLHDL 5 01/27/2018    Assessment & Plan:   Problem List Items Addressed This Visit    Low serum vitamin B12    rec continue b12 replacement.       Hyperlipidemia    Requests testing at f/u visit.  The 10-year ASCVD risk score Mikey Bussing DC Brooke Bonito., et al., 2013) is: 2.1%   Values used to calculate the score:     Age: 24 years     Sex: Female     Is Non-Hispanic African American: No     Diabetic: No     Tobacco smoker: No     Systolic Blood Pressure: 810 mmHg     Is BP treated:  No     HDL Cholesterol: 46 mg/dL     Total Cholesterol: 231 mg/dL       Health maintenance examination - Primary    Preventative protocols reviewed and updated unless pt declined. Discussed healthy diet and lifestyle.       Glossitis    Empirically trying mouthwash and alpha lipoic acid. Consider sjogren testing and ESR.       GERD (gastroesophageal reflux disease)    Confirmed by barium swallow, pending MBS. On protonix and pepcid. Appreciate GI care. Pt asks about candidiasis. Encouraged f/u with GI.       Relevant Medications   famotidine (PEPCID) 40 MG tablet   ETD (eustachian tube dysfunction)    Saw ENT dx ETD continue  flonase, add astelin.        Other Visit Diagnoses    Need for Tdap vaccination       Relevant Orders   Tdap vaccine greater than or equal to 7yo IM (Completed)       No orders of the defined types were placed in this encounter.  Orders Placed This Encounter  Procedures  . Tdap vaccine greater than or equal to 7yo IM    Follow up plan: Return in about 1 year (around 02/04/2020) for annual exam, prior fasting for blood work.  Ria Bush, MD

## 2019-02-04 NOTE — Assessment & Plan Note (Signed)
Saw ENT dx ETD continue flonase, add astelin.

## 2019-02-04 NOTE — Assessment & Plan Note (Signed)
Empirically trying mouthwash and alpha lipoic acid. Consider sjogren testing and ESR.

## 2019-02-04 NOTE — Assessment & Plan Note (Signed)
rec continue b12 replacement.

## 2019-02-04 NOTE — Patient Instructions (Addendum)
Tdap today.  Work on deep breathing through the mouth to help lungs open up. We will await modified barium swallow results.  RTC 1-2 mo f/u visit.   Megan Hayes (Health Maintenance, Female) Un estilo de vida saludable y los cuidados preventivos pueden favorecer considerablemente a la salud y Megan Hayes. Pregunte a su mdico cul es el cronograma de exmenes peridicos apropiado para usted. Esta es una buena oportunidad para consultarlo sobre cmo prevenir enfermedades y Megan Hayes sano. Adems de los controles, hay muchas otras cosas que puede hacer usted mismo. Los expertos han realizado numerosas investigaciones Megan Hayes cambios en el estilo de vida y las medidas de prevencin que, Megan Hayes, lo ayudarn a mantenerse sano. Solicite a su mdico ms informacin. EL PESO Y LA DIETA Consuma una dieta saludable.  Asegrese de Megan Hayes verduras, frutas, productos lcteos de bajo contenido de Megan Hayes y Advertising account Hayes.  No consuma muchos alimentos de alto contenido de grasas slidas, azcares agregados o sal.  Realice actividad fsica con regularidad. Esta es una de las prcticas ms importantes que puede hacer por su salud. ? La Megan Hayes de los adultos deben hacer ejercicio durante al menos 128mnutos por semana. El ejercicio debe aumentar la frecuencia cardaca y pActorla transpiracin (ejercicio de iCabin John. ? La mayora de los adultos tambin deben hacer ejercicios de elongacin al mToysRusveces a la semana. Agregue esto al su plan de ejercicio de intensidad moderada. Mantenga un peso saludable.  El ndice de masa corporal ([facility] es una medida que puede utilizarse para identificar posibles problemas de p[place] Proporciona una estimacin de la grasa corporal basndose en el peso y la altura. Su mdico puede ayudarle a dRadiation protection practitionerIJonesvilley a lScientist, forensico mTheatre managerun peso saludable.  Para las mujeres de 20aos o ms: ? Un ICommunity Hospitalmenor de 18,5 se  considera bajo peso. ? Un I[facility] LLCentre 18,5 y 24,9 es normal. ? Un IThe [facility]entre 25 y 29,9 se considera sobrepeso. ? Un IMC de 30 o ms se considera obesidad. Observe los niveles de colesterol y lpidos en la sangre.  Debe comenzar a rEnglish as a second language teacherde lpidos y cResearch officer, trade unionen la sangre a los 20aos y luego repetirlos cada 574aos  Es posible que nAutomotive engineerlos niveles de colesterol con mayor frecuencia si: ? Sus niveles de lpidos y colesterol son altos. ? Es mayor de 577OEU ? Presenta un alto riesgo de padecer enfermedades cardacas. DETECCIN DE CNCER Cncer de pulmn  Se recomienda realizar exmenes de deteccin de cncer de pulmn a personas adultas entre 524y 843aos que estn en riesgo de dHorticulturist, commercialde pulmn por sus antecedentes de consumo de tabaco.  Se recomienda una tomografa computarizada de baja dosis de los pulmones todos los aos a las personas que: ? Fuman actualmente. ? Hayan dejado el hbito en algn momento en los ltimos 15aos. ? Hayan fumado durante 30aos un paquete diario. Un paquete-ao equivale a fumar un promedio de un paquete de cigarrillos diario durante un ao.  Los exmenes de deteccin anuales deben continuar hasta que hayan pasado 15aos desde que dej de fumar.  Ya no debern realizarse si tiene un problema de salud que le impida recibir tratamiento para eScience writerde pulmn. Cncer de mama  Practique la autoconciencia de la mama. Esto significa reconocer la apariencia normal de sus mamas y cmo las siente.  Tambin significa realizar autoexmenes regulares de lJohnson & Johnson Informe a su mdico sobre cualquier cambio, sin importar cun  pequeo sea.  Si tiene entre 20 y 80 aos, un mdico debe realizarle un examen clnico de las mamas como parte del examen regular de [place], cada 1 a 3aos.  Si tiene 40aos o ms, debe Information systems manager clnico de las Microsoft. Tambin considere realizarse una [place]  ([place]) todos los [place].  Si tiene antecedentes familiares de cncer de mama, hable con su mdico para someterse a un estudio gentico.  Si tiene alto riesgo de Chief Financial Officer de mama, hable con su mdico para someterse a Public house manager y [employer].  La evaluacin del gen del cncer de mama (BRCA) se recomienda a mujeres que tengan familiares con cnceres relacionados con el BRCA. Los cnceres relacionados con el BRCA incluyen los siguientes: ? [place]. ? Ovario. ? Trompas. ? Cnceres de peritoneo.  Los resultados de la evaluacin determinarn la necesidad de asesoramiento gentico y de [place] de BRCA1 y BRCA2. Cncer de cuello del tero El mdico puede recomendarle que se haga pruebas peridicas de deteccin de cncer de los rganos de la pelvis (ovarios, tero y vagina). Estas pruebas incluyen un examen plvico, que abarca controlar si se produjeron cambios microscpicos en la superficie del cuello del tero (prueba de Papanicolaou). Pueden recomendarle que se haga estas pruebas cada 3aos, a partir de los 21aos.  A las mujeres que tienen entre 30 y 18aos, los mdicos pueden recomendarles que se sometan a exmenes plvicos y pruebas de Papanicolaou cada 33aos, o a la prueba de Papanicolaou y el examen plvico en combinacin con estudios de deteccin del virus del papiloma humano (VPH) cada 5aos. Algunos tipos de VPH aumentan el riesgo de Chief Financial Officer de cuello del tero. La prueba para la deteccin del VPH tambin puede realizarse a mujeres de cualquier edad cuyos resultados de la prueba de Papanicolaou no sean claros.  Es posible que otros mdicos no recomienden exmenes de deteccin a mujeres no embarazadas que se consideran sujetos de bajo riesgo de Chief Financial Officer de pelvis y que no tienen sntomas. Pregntele al mdico si un examen plvico de deteccin es adecuado para usted.  Si ha recibido un tratamiento para Science writer cervical o una enfermedad  que podra causar cncer, necesitar realizarse una prueba de Papanicolaou y controles durante al menos 65 aos de concluido el [place]. Si no se ha hecho el Papanicolaou con regularidad, debern volver a evaluarse los factores de riesgo (como tener un nuevo compaero sexual), para Teacher, adult education si debe realizarse los estudios nuevamente. Algunas mujeres sufren problemas mdicos que aumentan la probabilidad de Museum/gallery curator cncer de cuello del tero. En estos casos, el mdico podr [employer] se realicen controles y pruebas de Papanicolaou con ms frecuencia. Cncer colorrectal  Este tipo de cncer puede detectarse y a menudo prevenirse.  Por lo general, los estudios de rutina se deben Medical laboratory scientific officer a Field seismologist a Proofreader de los 12 aos y Norco 39 aos.  Sin embargo, el mdico podr aconsejarle que lo haga antes, si tiene factores de riesgo para el cncer de colon.  Tambin puede recomendarle que use un kit de prueba para Hydrologist en la materia fecal.  Es posible que se use una pequea cmara en el extremo de un tubo para examinar directamente el colon (sigmoidoscopia o colonoscopia) a fin de Hydrographic surveyor formas tempranas de cncer colorrectal.  Los exmenes de rutina generalmente comienzan a los 32aos.  El examen directo del colon se debe repetir cada 5 a 10aos hasta los  75aos. Sin embargo, es posible que se realicen exmenes con mayor frecuencia, si se detectan formas tempranas de plipos precancerosos o pequeos bultos. Cncer de piel  Revise la piel de la cabeza a los pies con regularidad.  Informe a su mdico si aparecen nuevos lunares o los que tiene se modifican, especialmente en su forma y color.  Tambin notifique al mdico si tiene un lunar que es ms grande que el tamao de una goma de lpiz.  Siempre use pantalla solar. Aplique pantalla solar de Kerry Dory y repetida a lo largo del Training and development officer.  Protjase usando mangas y [employer], un sombrero de ala ancha y gafas para el  sol, siempre que se encuentre en el exterior. ENFERMEDADES CARDACAS, DIABETES E HIPERTENSIN ARTERIAL  La hipertensin arterial causa enfermedades cardacas y [place] el riesgo de ictus. La hipertensin arterial es ms probable en los siguientes casos: ? Las personas que tienen la presin arterial en el extremo del rango normal (100-139/85-89 mm Hg). ? [employer] con sobrepeso u obesidad. ? Scientist, water quality.  Si usted tiene entre 18 y 39 aos, debe medirse la presin arterial cada 3 a 5 aos. Si usted tiene 40 aos o ms, debe medirse la presin arterial [employer]. Debe medirse la presin arterial dos veces: una vez cuando est en un hospital o una clnica y la otra vez cuando est en otro sitio. Registre el promedio de [place]. Para controlar su presin arterial cuando no est en un hospital o [place], puede usar lo siguiente: ? Jorje Guild automtica para medir la presin arterial en una farmacia. ? Un monitor para medir la presin arterial en el hogar.  Si tiene entre 42 y 77 aos, consulte a su mdico si debe tomar aspirina para prevenir el ictus.  Realcese exmenes de deteccin de la diabetes con regularidad. Esto incluye la toma de [place] de sangre para controlar el nivel de azcar en la sangre durante el [place]. ? Si tiene un peso normal y un bajo riesgo de padecer diabetes, realcese este anlisis cada tres aos despus de los 45aos. ? Si tiene sobrepeso y un alto riesgo de padecer diabetes, considere someterse a este anlisis antes o con mayor frecuencia. PREVENCIN DE INFECCIONES HepatitisB  Si tiene un riesgo ms alto de Museum/gallery curator hepatitis B, debe someterse a un examen de deteccin de este virus. Se considera que tiene un alto riesgo de contraer hepatitis B si: ? Naci en un pas donde la hepatitis B es frecuente. Pregntele a su mdico qu pases son considerados de Public affairs consultant. ? Sus padres nacieron en un pas de alto riesgo y usted no  recibi una vacuna que lo proteja contra la hepatitis B (vacuna contra la hepatitis B). ? [place]. ? [place] agujas para inyectarse drogas. ? Vive con alguien que tiene hepatitis B. ? Ha tenido sexo con alguien que tiene hepatitis B. ? Recibe tratamiento de hemodilisis. ? Toma ciertos medicamentos para el cncer, trasplante de rganos y afecciones autoinmunitarias. Hepatitis C  Se recomienda un anlisis de [place] para: ? [employer] 1945 y 1965. ? Todas las personas que tengan un riesgo de haber contrado hepatitis C. Enfermedades de transmisin sexual (ETS).  Debe realizarse pruebas de deteccin de enfermedades de transmisin sexual (ETS), incluidas gonorrea y clamidia si: ? Es sexualmente activo y es menor de 37JIR. ? Es mayor de 24aos, y Investment banker, operational informa que corre riesgo de tener este tipo de infecciones. ?  La actividad sexual ha cambiado desde que le hicieron la ltima prueba de deteccin y tiene un riesgo mayor de Best boy clamidia o Radio broadcast assistant. Pregntele al mdico si usted tiene riesgo.  Si no tiene el VIH, pero corre riesgo de infectarse por el virus, se recomienda tomar diariamente un medicamento recetado para evitar la infeccin. Esto se conoce como profilaxis previa a la exposicin. Se considera que est en riesgo si: ? Es [place] sexualmente y no [place] preservativos habitualmente o no conoce el estado del VIH de sus Advertising copywriter. ? Se inyecta drogas. ? Es [place] sexualmente con Ardelia Mems pareja que tiene VIH. Consulte a su mdico para saber si tiene un alto riesgo de infectarse por el VIH. Si opta por comenzar la profilaxis previa a la exposicin, primero debe realizarse anlisis de deteccin del VIH. Luego, le harn anlisis cada 45mses mientras est tomando los medicamentos para la profilaxis previa a la exposicin. ESt [facility] Si es premenopusica y puede quedar eEufaula solicite a su mdico asesoramiento previo a la concepcin.  Si puede quedar  embarazada, tome 400 a [id number](mcg) de cido fAnheuser-Busch  Si desea evitar el embarazo, hable con su mdico sobre el control de la natalidad (anticoncepcin). OSTEOPOROSIS Y MENOPAUSIA  La osteoporosis es una enfermedad en la que los huesos pierden los minerales y la fuerza por el avance de la edad. El resultado pueden ser fracturas graves en los hDana El riesgo de osteoporosis puede identificarse con uArdelia Memsprueba de densidad sea.  Si tiene 65aos o ms, o si est en riesgo de sufrir osteoporosis y fracturas, pregunte a su mdico si debe someterse a exmenes.  Consulte a su mdico si debe tomar un suplemento de calcio o de vitamina D para reducir el riesgo de osteoporosis.  La menopausia puede presentar ciertos sntomas fsicos y rGaffer  La terapia de reemplazo hormonal puede reducir algunos de estos sntomas y rGaffer Consulte a su mdico para saber si la terapia de reemplazo hormonal es conveniente para usted. INSTRUCCIONES PARA EL CUIDADO EN EL HOGAR  Realcese los estudios de rutina de la salud, dentales y de lPublic librarian  MD[place]  No consuma ningn producto que contenga tabaco, lo que incluye cigarrillos, tabaco de mHigher education careers advisero cPsychologist, sport and exercise  Si est embarazada, no beba alcohol.  Si est amamantando, reduzca el consumo de alcohol y la frecuencia con la que consume.  Si es mujer y no est embarazada limite el consumo de alcohol a no ms de 1 medida por da. Una medida equivale a 12onzas de cerveza, 5onzas de vino o 1onzas de bebidas alcohlicas de alta graduacin.  No consuma drogas.  No comparta agujas.  Solicite ayuda a su mdico si necesita apoyo o informacin para abandonar las drogas.  Informe a su mdico si a menudo se siente deprimido.  Notifique a su mdico si alguna vez ha sido vctima de abuso o si no se siente seguro en su hogar. Esta informacin no tiene cMarine scientistel consejo del mdico.  Asegrese de hacerle al mdico cualquier pregunta que tenga. Document Released: 07/19/2011 Document Revised: 08/20/2014 Document Reviewed: 05/03/2015 Elsevier Interactive Patient Education  2019 EReynolds American

## 2019-02-04 NOTE — Assessment & Plan Note (Signed)
Preventative protocols reviewed and updated unless pt declined. Discussed healthy diet and lifestyle.  

## 2019-02-04 NOTE — Assessment & Plan Note (Addendum)
Requests testing at f/u visit.  The 10-year ASCVD risk score Mikey Bussing DC Brooke Bonito., et al., 2013) is: 2.1%   Values used to calculate the score:     Age: 53 years     Sex: Female     Is Non-Hispanic African American: No     Diabetic: No     Tobacco smoker: No     Systolic Blood Pressure: 886 mmHg     Is BP treated: No     HDL Cholesterol: 46 mg/dL     Total Cholesterol: 231 mg/dL

## 2019-02-11 ENCOUNTER — Other Ambulatory Visit: Payer: Self-pay | Admitting: Family Medicine

## 2019-02-27 ENCOUNTER — Other Ambulatory Visit: Payer: Self-pay | Admitting: Family Medicine

## 2019-05-11 IMAGING — MG DIGITAL SCREENING BILATERAL MAMMOGRAM WITH TOMO AND CAD
8 series · 8 of 24 positions shown · non-contrast
Comparison: Previous exam(s).

CLINICAL DATA: Screening.

EXAM:
DIGITAL SCREENING BILATERAL MAMMOGRAM WITH TOMO AND CAD

[R CC synth-2D]
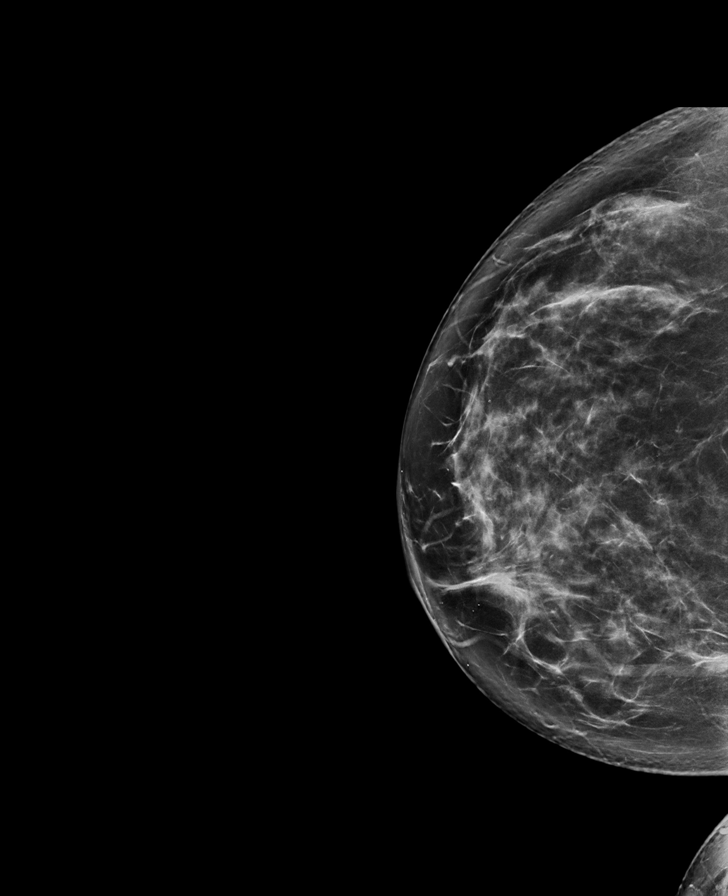

[L MLO synth-2D]
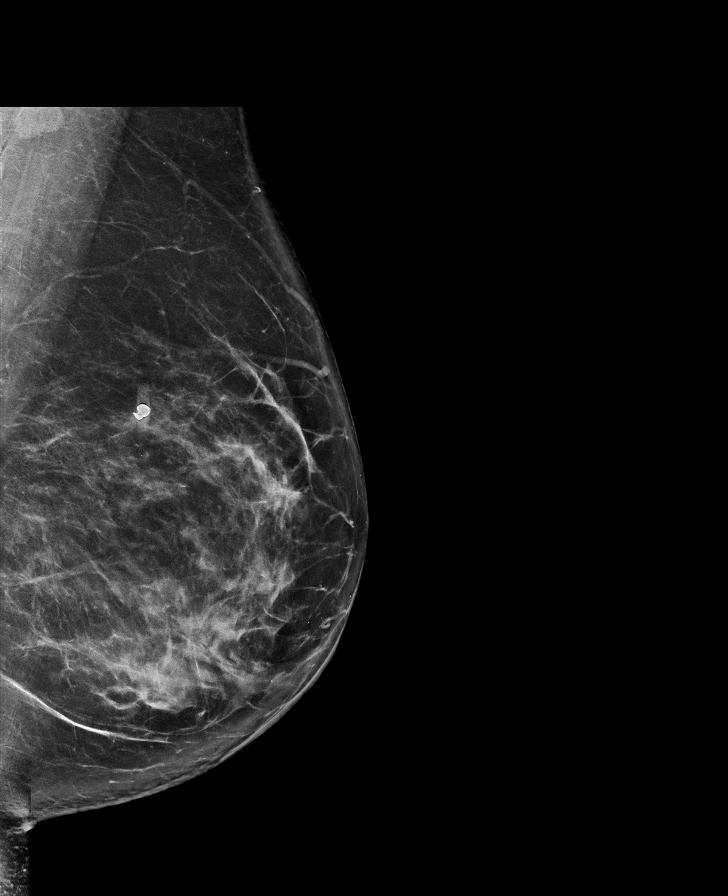

[R MLO synth-2D]
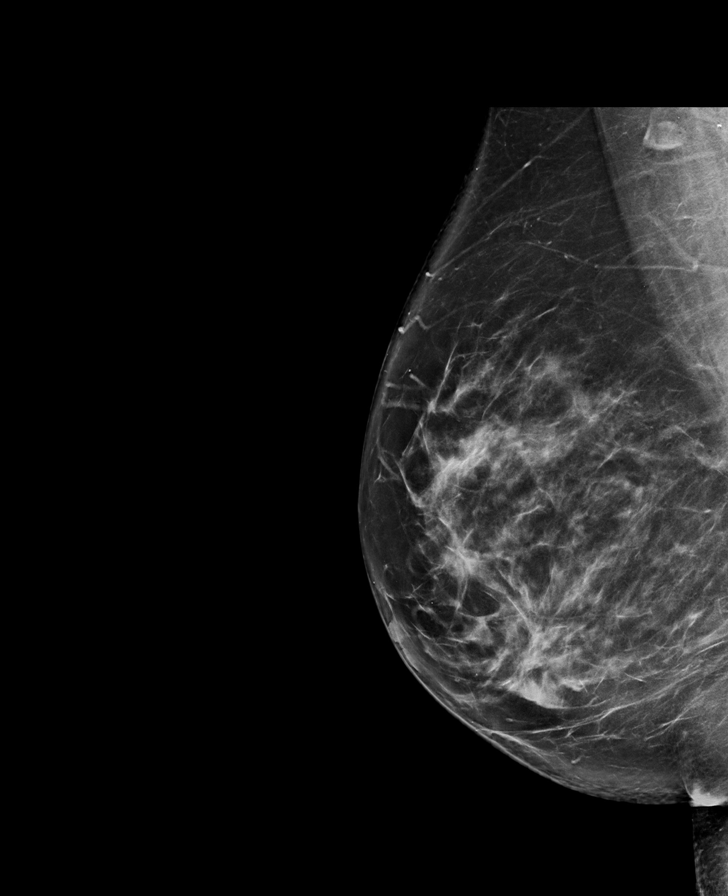

[L CC synth-2D]
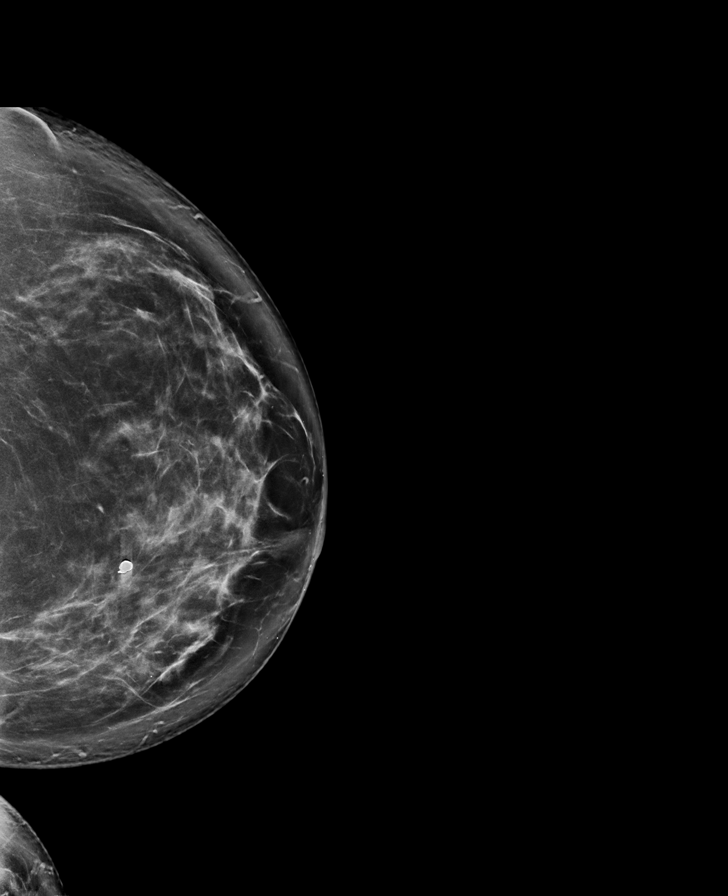

[L CC tomo · tomo slice 51/100.0]
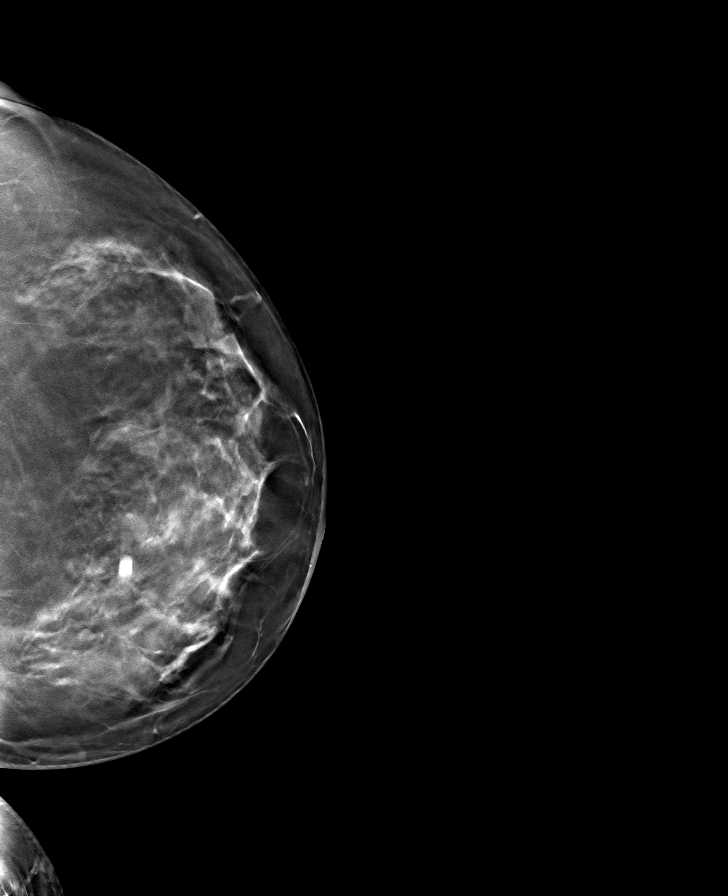

[L MLO tomo · tomo slice 49/98.0]
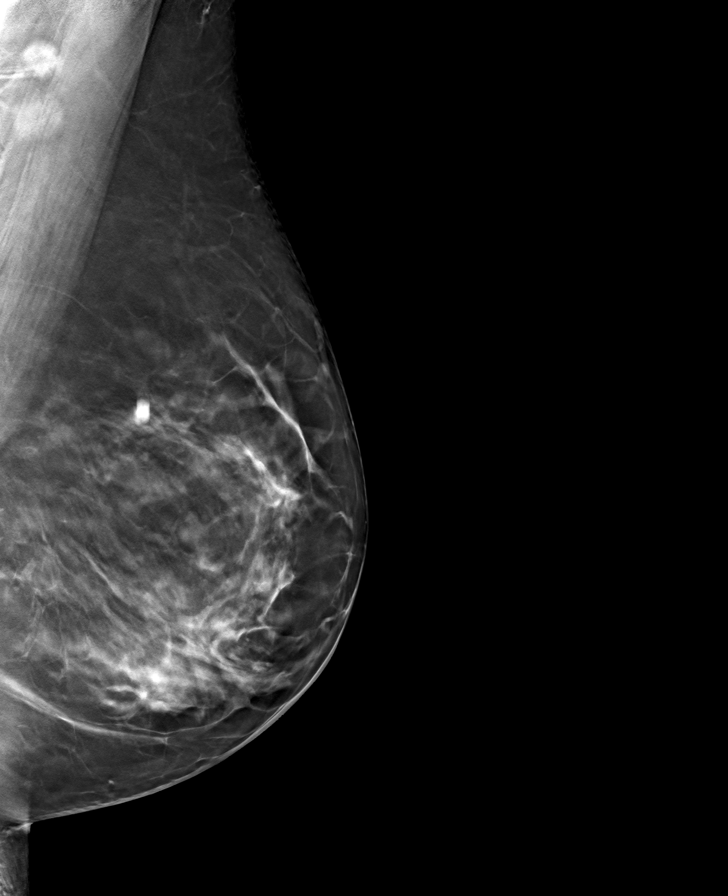

[R CC tomo · tomo slice 47/93.0]
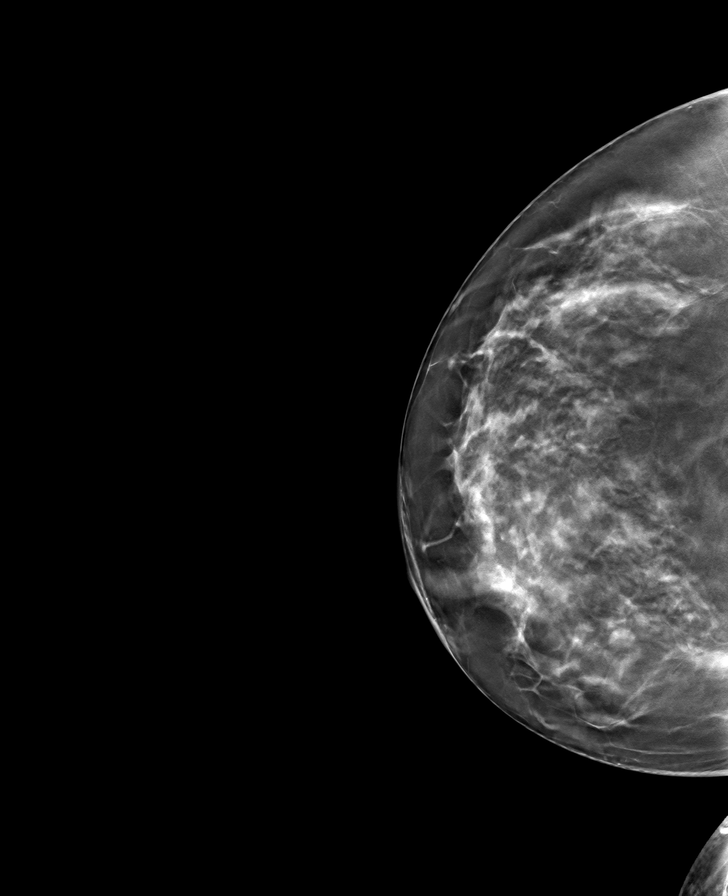

[R MLO tomo · tomo slice 51/100.0]
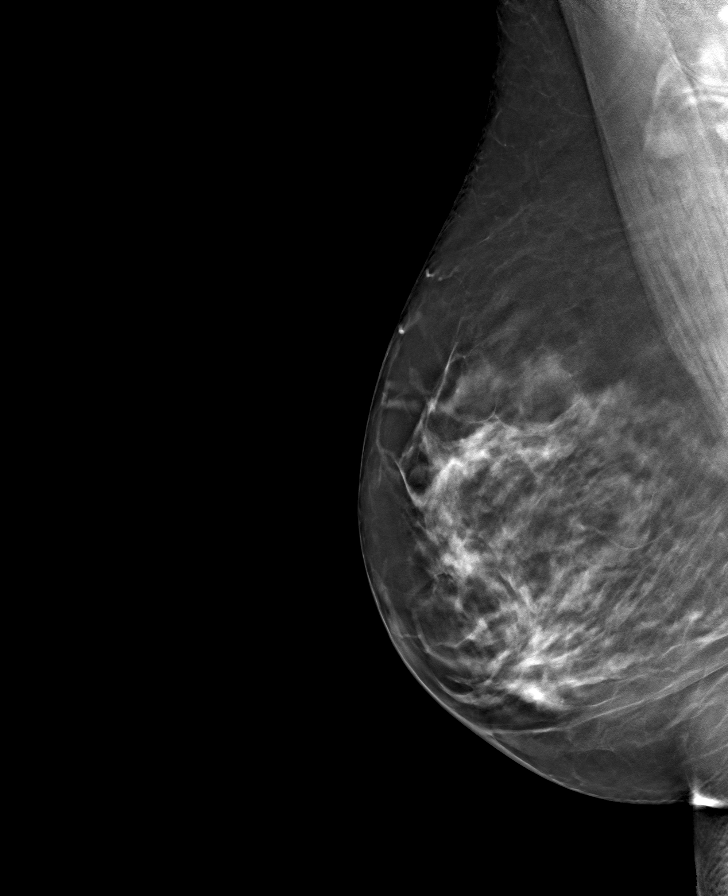

[8 of 24 positions shown; findings below may reference images not displayed]

ACR Breast Density Category b: There are scattered areas of
fibroglandular density.
FINDINGS: There are no findings suspicious for malignancy. Images were
processed with CAD.
IMPRESSION: No mammographic evidence of malignancy. A result letter of this
screening mammogram will be mailed directly to the patient.

RECOMMENDATION:
Screening mammogram in one year. (Code:[id number])

BI-RADS CATEGORY  1: Negative.

## 2019-05-13 ENCOUNTER — Ambulatory Visit (INDEPENDENT_AMBULATORY_CARE_PROVIDER_SITE_OTHER): Payer: BC Managed Care – PPO | Admitting: Family Medicine

## 2019-05-13 ENCOUNTER — Encounter: Payer: Self-pay | Admitting: Family Medicine

## 2019-05-13 ENCOUNTER — Other Ambulatory Visit: Payer: Self-pay

## 2019-05-13 VITALS — BP 118/72 | HR 70 | Temp 98.0°F | Ht 64.75 in | Wt 168.1 lb

## 2019-05-13 DIAGNOSIS — E538 Deficiency of other specified B group vitamins: Secondary | ICD-10-CM

## 2019-05-13 DIAGNOSIS — E559 Vitamin D deficiency, unspecified: Secondary | ICD-10-CM

## 2019-05-13 DIAGNOSIS — K140 Glossitis: Secondary | ICD-10-CM | POA: Diagnosis not present

## 2019-05-13 DIAGNOSIS — H6981 Other specified disorders of Eustachian tube, right ear: Secondary | ICD-10-CM

## 2019-05-13 DIAGNOSIS — K219 Gastro-esophageal reflux disease without esophagitis: Secondary | ICD-10-CM | POA: Diagnosis not present

## 2019-05-13 DIAGNOSIS — Z23 Encounter for immunization: Secondary | ICD-10-CM | POA: Diagnosis not present

## 2019-05-13 DIAGNOSIS — E782 Mixed hyperlipidemia: Secondary | ICD-10-CM | POA: Diagnosis not present

## 2019-05-13 MED ORDER — VITAMIN D 50 MCG (2000 UT) PO CAPS: 1.0000 | ORAL_CAPSULE | Freq: Every day | ORAL | Status: AC

## 2019-05-13 MED ORDER — VITAMIN D3 1.25 MG (50000 UT) PO TABS: 1.0000 | ORAL_TABLET | ORAL | 0 refills | Status: AC

## 2019-05-13 NOTE — Patient Instructions (Addendum)
Flu shot today Check with insurance about shingrix vaccine.  Tome vitamina D 50,000 unidades semanal por 2-3 meses y me deja saber efecto en articulaciones.  Use oragel artificial saliva para irritacion de la boca.  Regresar en 2 meses para visita de laboratorio (lab visit only).

## 2019-05-13 NOTE — Progress Notes (Signed)
This visit was conducted in person.  BP 118/72 (BP Location: Left Arm, Patient Position: Sitting, Cuff Size: Normal)   Pulse 70   Temp 98 F (36.7 C) (Temporal)   Ht 5' 4.75" (1.645 m)   Wt 168 lb 2 oz (76.3 kg)   SpO2 96%   BMI 28.19 kg/m    CC: review several issues Subjective:    Patient ID: Megan Hayes, female    DOB: 1966/04/24, 53 y.o.   MRN: [id number]  HPI: Megan Hayes is a 53 y.o. female presenting on 05/13/2019 for Other (Wants to discuss pH levels.  Noticed they are low. )   Ongoing joint pain, body aches despite taking vitamin D. Describes diffuse joint aches throughout body markedly worse if she misses vit D dose. No swelling, redness or warmth noted.   Ongoing loss of taste/smell for over a year.  Saw ENT Richardson Landry) without cause found.  Has seen allergist with reassuring eval.  Didn't note benefit with singulair - now off this.   Ongoing tongue swelling as well - that has flared with certain foods/drink - wheat, green juices, even with filtered water at times. Brings picture from yesterday showing midline fissure of tongue. Currently doing better.  Normal dental exam 01/2019.  Uses colgate - changed to fluroride-free dental paste without benefit. Has used alcohol free mouthwash. Some improvement using coconut oil.   She has been checking pH at home - notes urine pH down to 5.5. no urinary symptoms.      Relevant past medical, surgical, family and social history reviewed and updated as indicated. Interim medical history since our last visit reviewed. Allergies and medications reviewed and updated. Outpatient Medications Prior to Visit  Medication Sig Dispense Refill  . Cyanocobalamin (B-12) 1000 MCG SUBL Place 1 tablet under the tongue daily. 30 each   . fluticasone (FLONASE) 50 MCG/ACT nasal spray SPRAY 2 SPRAYS INTO EACH NOSTRIL EVERY DAY 48 mL 2  . levocetirizine (XYZAL) 5 MG tablet Take 1 tablet by mouth at bedtime.    . Multiple  Vitamins-Minerals (MULTIVITAMIN ADULT PO) Take by mouth daily.    . Cholecalciferol (VITAMIN D3) 125 MCG (5000 UT) CAPS Take 1 capsule by mouth daily.    . vitamin C (ASCORBIC ACID) 500 MG tablet Take 500 mg by mouth daily.    . Albuterol Sulfate (PROAIR RESPICLICK) 414 (90 Base) MCG/ACT AEPB Inhale into the lungs every 6 (six) hours as needed for wheezing. 1-2 puffs every 4-6 hours as needed for cough/wheezing    . Alpha-Lipoic Acid 200 MG CAPS Take 1 capsule by mouth 3 (three) times daily.    . famotidine (PEPCID) 40 MG tablet Take 1 tablet by mouth at bedtime.    . Lactobacillus (PROBIOTIC ACIDOPHILUS PO) Take 1 capsule by mouth daily.    . montelukast (SINGULAIR) 10 MG tablet Take 1 tablet by mouth at bedtime.    . pantoprazole (PROTONIX) 40 MG tablet TAKE 1 TABLET BY MOUTH EVERY DAY (Patient not taking: As needed) 90 tablet 1   No facility-administered medications prior to visit.      Per HPI unless specifically indicated in ROS section below Review of Systems Objective:    BP 118/72 (BP Location: Left Arm, Patient Position: Sitting, Cuff Size: Normal)   Pulse 70   Temp 98 F (36.7 C) (Temporal)   Ht 5' 4.75" (1.645 m)   Wt 168 lb 2 oz (76.3 kg)   SpO2 96%   BMI  28.19 kg/m   Wt Readings from Last 3 Encounters:  05/13/19 168 lb 2 oz (76.3 kg)  02/04/19 171 lb 3 oz (77.7 kg)  06/16/18 169 lb (76.7 kg)    Physical Exam Vitals signs and nursing note reviewed.  Constitutional:      General: She is not in acute distress.    Appearance: Normal appearance. She is not ill-appearing.  HENT:     Head: Normocephalic.     Right Ear: Tympanic membrane and ear canal normal.     Left Ear: Tympanic membrane and ear canal normal.     Mouth/Throat:     Mouth: Mucous membranes are moist.     Pharynx: Oropharynx is clear. No oropharyngeal exudate or posterior oropharyngeal erythema.  Eyes:     Extraocular Movements: Extraocular movements intact.     Conjunctiva/sclera: Conjunctivae  normal.     Pupils: Pupils are equal, round, and reactive to light.  Neck:     Musculoskeletal: Normal range of motion and neck supple. No neck rigidity or muscular tenderness.     Vascular: No carotid bruit.  Cardiovascular:     Rate and Rhythm: Normal rate and regular rhythm.     Pulses: Normal pulses.     Heart sounds: Normal heart sounds. No murmur.  Pulmonary:     Effort: Pulmonary effort is normal. No respiratory distress.     Breath sounds: Normal breath sounds. No wheezing, rhonchi or rales.  Musculoskeletal:     Right lower leg: No edema.     Left lower leg: No edema.  Skin:    Findings: No rash.  Neurological:     Mental Status: She is alert.  Psychiatric:        Mood and Affect: Mood normal.        Behavior: Behavior normal.       Results for orders placed or performed in visit on 11/10/18  VITAMIN D 25 Hydroxy (Vit-D Deficiency, Fractures)  Result Value Ref Range   VITD 32.44 30.00 - 100.00 ng/mL  Vitamin B12  Result Value Ref Range   Vitamin B-12 262 211 - 911 pg/mL  Ferritin  Result Value Ref Range   Ferritin 34.6 10.0 - 291.0 ng/mL  Comprehensive metabolic panel  Result Value Ref Range   Sodium 138 135 - 145 mEq/L   Potassium 3.9 3.5 - 5.1 mEq/L   Chloride 103 96 - 112 mEq/L   CO2 27 19 - 32 mEq/L   Glucose, Bld 79 70 - 99 mg/dL   BUN 12 6 - 23 mg/dL   Creatinine, Ser 0.80 0.40 - 1.20 mg/dL   Total Bilirubin 0.2 0.2 - 1.2 mg/dL   Alkaline Phosphatase 71 39 - 117 U/L   AST 20 0 - 37 U/L   ALT 20 0 - 35 U/L   Total Protein 7.3 6.0 - 8.3 g/dL   Albumin 4.2 3.5 - 5.2 g/dL   Calcium 9.5 8.4 - 10.5 mg/dL   GFR 74.97 >60.00 mL/min  CBC with Differential/Platelet  Result Value Ref Range   WBC 5.6 4.0 - 10.5 K/uL   RBC 4.93 3.87 - 5.11 Mil/uL   Hemoglobin 13.0 12.0 - 15.0 g/dL   HCT 39.0 36.0 - 46.0 %   MCV 79.2 78.0 - 100.0 fl   MCHC 33.2 30.0 - 36.0 g/dL   RDW 13.8 11.5 - 15.5 %   Platelets 287.0 150.0 - 400.0 K/uL   Neutrophils Relative % 45.3  43.0 - 77.0 %   Lymphocytes Relative  42.5 12.0 - 46.0 %   Monocytes Relative 8.5 3.0 - 12.0 %   Eosinophils Relative 2.8 0.0 - 5.0 %   Basophils Relative 0.9 0.0 - 3.0 %   Neutro Abs 2.5 1.4 - 7.7 K/uL   Lymphs Abs 2.4 0.7 - 4.0 K/uL   Monocytes Absolute 0.5 0.1 - 1.0 K/uL   Eosinophils Absolute 0.2 0.0 - 0.7 K/uL   Basophils Absolute 0.1 0.0 - 0.1 K/uL  Tissue Transglutaminase, IGA  Result Value Ref Range   (tTG) Ab, IgA 1 U/mL  Folate  Result Value Ref Range   Folate 22.4 >5.9 ng/mL   Assessment & Plan:  Discussed recent pH readings - anticipate normal range.  Problem List Items Addressed This Visit    Vitamin D deficiency    Doing well with vit D 2000 IU daily, however notes arthralgias with missed dose. Will trial weekly vit D for next few weeks, update level next labs.       Low serum vitamin B12    Continue b12 109mg sublingual dissolvable tablet.       Hyperlipidemia    Check next labs      Glossitis - Primary    Ongoing despite alpha lipoic acid trial.  S/p benign ENT eval.  No signs of oral thrush or fissured tongue today.  Consider sjogren testing/ESR.       GERD (gastroesophageal reflux disease)    Overall stable period off PPI.       ETD (eustachian tube dysfunction)    Ongoing taste/smell loss over a year s/p ENT eval thought due to ETD. Continue flonase.        Other Visit Diagnoses    Need for influenza vaccination       Relevant Orders   Flu Vaccine QUAD 36+ mos IM (Completed)       Meds ordered this encounter  Medications  . Cholecalciferol (VITAMIN D) 50 MCG (2000 UT) CAPS    Sig: Take 1 capsule (2,000 Units total) by mouth daily.    Dispense:  30 capsule  . Cholecalciferol (VITAMIN D3) 1.25 MG (50000 UT) TABS    Sig: Take 1 tablet by mouth once a week.    Dispense:  12 tablet    Refill:  0   Orders Placed This Encounter  Procedures  . Flu Vaccine QUAD 36+ mos IM    Patient Instructions  Flu shot today Check with insurance  about shingrix vaccine.  Tome vitamina D 50,000 unidades semanal por 2-3 meses y me deja saber efecto en articulaciones.  Use oragel artificial saliva para irritacion de la boca.  Regresar en 2 meses para visita de laboratorio (lab visit only).    Follow up plan: No follow-ups on file.  JRia Bush MD

## 2019-05-14 NOTE — Assessment & Plan Note (Signed)
Doing well with vit D 2000 IU daily, however notes arthralgias with missed dose. Will trial weekly vit D for next few weeks, update level next labs.

## 2019-05-14 NOTE — Assessment & Plan Note (Addendum)
Ongoing despite alpha lipoic acid trial.  S/p benign ENT eval.  No signs of oral thrush or fissured tongue today.  Suggested oragel for dry mouth.  Consider sjogren testing/ESR.

## 2019-05-14 NOTE — Assessment & Plan Note (Signed)
Check next labs. 

## 2019-05-14 NOTE — Assessment & Plan Note (Signed)
Overall stable period off PPI.

## 2019-05-14 NOTE — Assessment & Plan Note (Signed)
Continue b12 1074mcg sublingual dissolvable tablet.

## 2019-05-14 NOTE — Assessment & Plan Note (Signed)
Ongoing taste/smell loss over a year s/p ENT eval thought due to ETD. Continue flonase.

## 2019-05-26 ENCOUNTER — Encounter: Payer: Self-pay | Admitting: Family Medicine

## 2019-06-29 ENCOUNTER — Other Ambulatory Visit: Payer: Self-pay | Admitting: Family Medicine

## 2019-06-29 DIAGNOSIS — Z1231 Encounter for screening mammogram for malignant neoplasm of breast: Secondary | ICD-10-CM

## 2019-07-13 ENCOUNTER — Other Ambulatory Visit: Payer: BC Managed Care – PPO

## 2019-07-14 ENCOUNTER — Ambulatory Visit
Admission: RE | Admit: 2019-07-14 | Discharge: 2019-07-14 | Disposition: A | Discharge status: Home / Self Care | Payer: BC Managed Care – PPO | Source: Ambulatory Visit | Attending: Family Medicine | Admitting: Family Medicine

## 2019-07-14 DIAGNOSIS — Z1231 Encounter for screening mammogram for malignant neoplasm of breast: Secondary | ICD-10-CM

## 2019-07-14 LAB — HM MAMMOGRAPHY

## 2019-07-15 ENCOUNTER — Other Ambulatory Visit: Payer: Self-pay

## 2019-07-15 ENCOUNTER — Encounter: Payer: Self-pay | Admitting: Obstetrics & Gynecology

## 2019-07-15 ENCOUNTER — Other Ambulatory Visit (HOSPITAL_COMMUNITY)
Admission: RE | Admit: 2019-07-15 | Discharge: 2019-07-15 | Disposition: A | Discharge status: Home / Self Care | Payer: BC Managed Care – PPO | Source: Ambulatory Visit | Attending: Obstetrics & Gynecology | Admitting: Obstetrics & Gynecology

## 2019-07-15 ENCOUNTER — Other Ambulatory Visit (INDEPENDENT_AMBULATORY_CARE_PROVIDER_SITE_OTHER): Payer: BC Managed Care – PPO

## 2019-07-15 ENCOUNTER — Other Ambulatory Visit: Payer: Self-pay | Admitting: Family Medicine

## 2019-07-15 ENCOUNTER — Ambulatory Visit (INDEPENDENT_AMBULATORY_CARE_PROVIDER_SITE_OTHER): Payer: BC Managed Care – PPO | Admitting: Obstetrics & Gynecology

## 2019-07-15 VITALS — BP 110/70 | Ht 64.0 in | Wt 170.0 lb

## 2019-07-15 DIAGNOSIS — E559 Vitamin D deficiency, unspecified: Secondary | ICD-10-CM

## 2019-07-15 DIAGNOSIS — N898 Other specified noninflammatory disorders of vagina: Secondary | ICD-10-CM

## 2019-07-15 DIAGNOSIS — E538 Deficiency of other specified B group vitamins: Secondary | ICD-10-CM

## 2019-07-15 DIAGNOSIS — E782 Mixed hyperlipidemia: Secondary | ICD-10-CM

## 2019-07-15 DIAGNOSIS — Z01419 Encounter for gynecological examination (general) (routine) without abnormal findings: Secondary | ICD-10-CM

## 2019-07-15 DIAGNOSIS — Z1231 Encounter for screening mammogram for malignant neoplasm of breast: Secondary | ICD-10-CM

## 2019-07-15 DIAGNOSIS — Z1211 Encounter for screening for malignant neoplasm of colon: Secondary | ICD-10-CM

## 2019-07-15 LAB — LIPID PANEL
Cholesterol: 257 mg/dL — ABNORMAL HIGH (ref 0–200)
HDL: 45.5 mg/dL (ref >39.00)
LDL Cholesterol: 174 mg/dL — ABNORMAL HIGH (ref 0–99)
NonHDL: 211.65
Total CHOL/HDL Ratio: 6
Triglycerides: 188 mg/dL — ABNORMAL HIGH (ref 0.0–149.0)
VLDL: 37.6 mg/dL (ref 0.0–40.0)

## 2019-07-15 LAB — COMPREHENSIVE METABOLIC PANEL
ALT: 16 U/L (ref 0–35)
AST: 16 U/L (ref 0–37)
Albumin: 4.2 g/dL (ref 3.5–5.2)
Alkaline Phosphatase: 71 U/L (ref 39–117)
BUN: 15 mg/dL (ref 6–23)
CO2: 30 mEq/L (ref 19–32)
Calcium: 9.6 mg/dL (ref 8.4–10.5)
Chloride: 104 mEq/L (ref 96–112)
Creatinine, Ser: 0.79 mg/dL (ref 0.40–1.20)
GFR: 75.87 mL/min (ref >60.00)
Glucose, Bld: 84 mg/dL (ref 70–99)
Potassium: 3.9 mEq/L (ref 3.5–5.1)
Sodium: 141 mEq/L (ref 135–145)
Total Bilirubin: 0.5 mg/dL (ref 0.2–1.2)
Total Protein: 6.9 g/dL (ref 6.0–8.3)

## 2019-07-15 LAB — VITAMIN D 25 HYDROXY (VIT D DEFICIENCY, FRACTURES): VITD: 31.32 ng/mL (ref 30.00–100.00)

## 2019-07-15 LAB — VITAMIN B12: Vitamin B-12: 598 pg/mL (ref 211–911)

## 2019-07-15 NOTE — Progress Notes (Signed)
HPI:      [name] is a 53 y.o. [id number] who LMP was in the past and s/p hysterectomy, she presents today for her annual examination.  The patient has no complaints today. The patient is sexually active. Herlast pap: approximate date 2016 and was normal and last mammogram: approximate date 2019 and was normal.  The patient does perform self breast exams.  There is no notable family history of breast or ovarian cancer in her family. The patient is not taking hormone replacement therapy. Patient denies post-menopausal vaginal bleeding.  She does have occas vag d/c; no itch or burn or odor or fever. The patient has regular exercise: yes. The patient denies current symptoms of depression.    GYN Hx: Last Colonoscopy:2 years ago. Normal.  Last DEXA: never ago.    PMHx: Past Medical History:  Diagnosis Date  . Abnormal Pap smear of cervix    s/p LEEP then hysterectomy  . Endometriosis   . Hyperlipidemia   . Lactose intolerance in adult    Past Surgical History:  Procedure Laterality Date  . ABDOMINAL HYSTERECTOMY  2004   abnormal pap, ovaries remain [facility]([place]  . COLONOSCOPY WITH PROPOFOL N/A 01/18/2016   WNL per patient (Seeplaputhur Wynona LunaG Sankar, MD)  . REDUCTION MAMMAPLASTY Bilateral 2008  . REFRACTIVE SURGERY  1994  . TENOSYNOVECTOMY Right 08/18/2016   Procedure: TENOSYNOVECTOMY;  Surgeon: Bradly BienenstockFred Ortmann, MD;  [facility];  Service: Orthopedics;  Laterality: Right;  . TENOSYNOVECTOMY Left 05/2016   Melvyn Novasrtmann   Family History  Problem Relation Age of Onset  . Cancer Maternal Aunt 65       colon  . Cancer Maternal Uncle        liver  . Cancer Maternal Grandmother        uterine  . Cancer Maternal Grandfather        liver  . CAD Paternal Grandmother   . Cancer Maternal Uncle        throat  . CAD Paternal Uncle   . Diabetes Neg Hx    Social History   Tobacco Use  . Smoking status: Never Smoker  . Smokeless tobacco: Never Used  Substance Use Topics  .  Alcohol use: Yes    Alcohol/week: 1.0 - 2.0 standard drinks    Types: 1 - 2 Standard drinks or equivalent per week    Comment: occasional  . Drug use: No    Current Outpatient Medications:  .  Albuterol Sulfate (PROAIR RESPICLICK) 108 (90 Base) MCG/ACT AEPB, Inhale into the lungs every 6 (six) hours as needed for wheezing. 1-2 puffs every 4-6 hours as needed for cough/wheezing, Disp: , Rfl:  .  Alpha-Lipoic Acid 200 MG CAPS, Take 1 capsule by mouth 3 (three) times daily., Disp: , Rfl:  .  Cholecalciferol (VITAMIN D) 50 MCG (2000 UT) CAPS, Take 1 capsule (2,000 Units total) by mouth daily., Disp: 30 capsule, Rfl:  .  Cholecalciferol (VITAMIN D3) 1.25 MG (50000 UT) TABS, Take 1 tablet by mouth once a week., Disp: 12 tablet, Rfl: 0 .  Cyanocobalamin (B-12) 1000 MCG SUBL, Place 1 tablet under the tongue daily., Disp: 30 each, Rfl:  .  fluticasone (FLONASE) 50 MCG/ACT nasal spray, SPRAY 2 SPRAYS INTO EACH NOSTRIL EVERY DAY, Disp: 48 mL, Rfl: 2 .  Lactobacillus (PROBIOTIC ACIDOPHILUS PO), Take 1 capsule by mouth daily., Disp: , Rfl:  .  levocetirizine (XYZAL) 5 MG tablet, Take 1 tablet by mouth at bedtime., Disp: ,  Rfl:  .  Multiple Vitamins-Minerals (MULTIVITAMIN ADULT PO), Take by mouth daily., Disp: , Rfl:  .  famotidine (PEPCID) 40 MG tablet, Take 1 tablet by mouth at bedtime., Disp: , Rfl:  Allergies: Amoxicillin  Review of Systems  Constitutional: Negative for chills, fever and malaise/fatigue.  HENT: Negative for congestion, sinus pain and sore throat.   Eyes: Negative for blurred vision and pain.  Respiratory: Negative for cough and wheezing.   Cardiovascular: Negative for chest pain and leg swelling.  Gastrointestinal: Negative for abdominal pain, constipation, diarrhea, heartburn, nausea and vomiting.  Genitourinary: Negative for dysuria, frequency, hematuria and urgency.  Musculoskeletal: Negative for back pain, joint pain, myalgias and neck pain.  Skin: Negative for itching and  rash.  Neurological: Negative for dizziness, tremors and weakness.  Endo/Heme/Allergies: Does not bruise/bleed easily.  Psychiatric/Behavioral: Negative for depression. The patient is not nervous/anxious and does not have insomnia.     Objective: BP 110/70   Ht 5\' 4"  (1.626 m)   Wt 170 lb (77.1 kg)   BMI 29.18 kg/m   Filed Weights   07/15/19 1535  Weight: 170 lb (77.1 kg)   Body mass index is 29.18 kg/m. Physical Exam Constitutional:      General: She is not in acute distress.    Appearance: She is well-developed.  Genitourinary:     Pelvic exam was performed with patient supine.     Vagina and rectum normal.     No lesions in the vagina.     No vaginal bleeding.     No right or left adnexal mass present.     Right adnexa not tender.     Left adnexa not tender.     Genitourinary Comments: Absent Uterus Absent cervix Vaginal cuff well healed  HENT:     Head: Normocephalic and atraumatic. No laceration.     Right Ear: Hearing normal.     Left Ear: Hearing normal.     Mouth/Throat:     Pharynx: Uvula midline.  Eyes:     Pupils: Pupils are equal, round, and reactive to light.  Neck:     Musculoskeletal: Normal range of motion and neck supple.     Thyroid: No thyromegaly.  Cardiovascular:     Rate and Rhythm: Normal rate and regular rhythm.     Heart sounds: No murmur. No friction rub. No gallop.   Pulmonary:     Effort: Pulmonary effort is normal. No respiratory distress.     Breath sounds: Normal breath sounds. No wheezing.  Chest:     Breasts:        Right: No mass, skin change or tenderness.        Left: No mass, skin change or tenderness.  Abdominal:     General: Bowel sounds are normal. There is no distension.     Palpations: Abdomen is soft.     Tenderness: There is no abdominal tenderness. There is no rebound.  Musculoskeletal: Normal range of motion.  Neurological:     Mental Status: She is alert and oriented to person, place, and time.     Cranial  Nerves: No cranial nerve deficit.  Skin:    General: Skin is warm and dry.  Psychiatric:        Judgment: Judgment normal.  Vitals signs reviewed.     Assessment: Annual Exam 1. Women's annual routine gynecological examination   2. Encounter for screening mammogram for malignant neoplasm of breast   3. Vaginal discharge  Plan:            1.  Cervical Screening-  Pap smear schedule reviewed with patient  2. Breast screening- Exam annually and mammogram scheduled  3. Colonoscopy every 10 years, Hemoccult testing after age 5  4. Labs managed by PCP  5. Counseling for hormonal therapy: none              6. FRAX - FRAX score for assessing the 10 year probability for fracture calculated and discussed today.  Based on age and score today, DEXA is not currently scheduled.   7. Boric acid discussed for irreg vag discharge    F/U  Return in about 1 year (around 07/14/2020) for Annual.  Barnett Applebaum, MD, [facility], [facility] 07/15/2019  4:14 PM

## 2019-07-15 NOTE — Patient Instructions (Signed)
PAP every 5 years Mammogram every year Colonoscopy every 10 years Labs yearly (with PCP)  Boric Acid vaginal suppository What is this medicine? BORIC ACID (BOHR ik AS id) helps to promote the proper acid balance in the vagina. It is used to help treat yeast infections of the vagina and relieve symptoms such as itching and burning. This medicine may be used for other purposes; ask your health care provider or pharmacist if you have questions. COMMON BRAND NAME(S): Hylafem What should I tell my health care provider before I take this medicine? They need to know if you have any of these conditions:  diabetes  frequent infections  HIV or AIDS  immune system problems  an unusual or allergic reaction to boric acid, other medicines, foods, dyes, or preservatives  pregnant or trying to get pregnant  breast-feeding How should I use this medicine? This medicine is for use in the vagina. Do not take by mouth. Follow the directions on the prescription label. Read package directions carefully before using. Wash hands before and after use. Use this medicine at bedtime, unless otherwise directed by your doctor. Do not use your medicine more often than directed. Do not stop using this medicine except on your doctor's advice. Talk to your pediatrician regarding the use of this medicine in children. This medicine is not approved for use in children. Overdosage: If you think you have taken too much of this medicine contact a poison control center or emergency room at once. NOTE: This medicine is only for you. Do not share this medicine with others. What if I miss a dose? If you miss a dose, use it as soon as you can. If it is almost time for your next dose, use only that dose. Do not use double or extra doses. What may interact with this medicine? Interactions are not expected. Do not use any other vaginal products without telling your doctor or health care professional. This list may not describe  all possible interactions. Give your health care provider a list of all the medicines, herbs, non-prescription drugs, or dietary supplements you use. Also tell them if you smoke, drink alcohol, or use illegal drugs. Some items may interact with your medicine. What should I watch for while using this medicine? Tell your doctor or health care professional if your symptoms do not start to get better within a few days. It is better not to have sex until you have finished your treatment. This medicine may damage condoms or diaphragms and cause them not to work properly. It may also decrease the effect of vaginal spermicides. Do not rely on any of these methods to prevent sexually transmitted diseases or pregnancy while you are using this medicine. Vaginal medicines usually will come out of the vagina during treatment. To keep the medicine from getting on your clothing, wear a panty liner. The use of tampons is not recommended. To help clear up the infection, wear freshly washed cotton, not synthetic, underwear. What side effects may I notice from receiving this medicine? Side effects that you should report to your doctor or health care professional as soon as possible:  allergic reactions like skin rash, itching or hives  vaginal irritation, redness, or burning Side effects that usually do not require medical attention (report to your doctor or health care professional if they continue or are bothersome):  vaginal discharge This list may not describe all possible side effects. Call your doctor for medical advice about side effects. You may report side effects  to FDA at 1-800-FDA-1088. Where should I keep my medicine? Keep out of the reach of children. Store in a cool, dry place between 15 and 30 degrees C (59 and 86 degrees F). Keep away from sunlight. Throw away any unused medicine after the expiration date. NOTE: This sheet is a summary. It may not cover all possible information. If you have questions  about this medicine, talk to your doctor, pharmacist, or health care provider.  2020 Elsevier/Gold Standard (2015-09-01 07:29:58)

## 2019-07-15 NOTE — Addendum Note (Signed)
Addended by: Gae Dry on: 07/15/2019 04:18 PM   Modules accepted: Orders

## 2019-07-16 ENCOUNTER — Encounter: Payer: Self-pay | Admitting: Family Medicine

## 2019-07-17 LAB — CERVICOVAGINAL ANCILLARY ONLY
Bacterial Vaginitis (gardnerella): POSITIVE — AB
Candida Glabrata: NEGATIVE
Candida Vaginitis: NEGATIVE
Comment: NEGATIVE
Comment: NEGATIVE
Comment: NEGATIVE

## 2019-07-18 DIAGNOSIS — Z1211 Encounter for screening for malignant neoplasm of colon: Secondary | ICD-10-CM | POA: Diagnosis not present

## 2019-07-20 ENCOUNTER — Other Ambulatory Visit: Payer: Self-pay | Admitting: Obstetrics & Gynecology

## 2019-07-20 MED ORDER — METRONIDAZOLE 500 MG PO TABS: 500.0000 mg | ORAL_TABLET | Freq: Two times a day (BID) | ORAL | 0 refills | Status: DC

## 2019-07-20 NOTE — Progress Notes (Signed)
Let her know culture shows BV; continue Boric Acid as discussed, but also take ABX for a week to help as well.

## 2019-07-20 NOTE — Progress Notes (Signed)
Pt aware.

## 2019-07-22 ENCOUNTER — Encounter: Payer: Self-pay | Admitting: Family Medicine

## 2019-07-22 ENCOUNTER — Ambulatory Visit (INDEPENDENT_AMBULATORY_CARE_PROVIDER_SITE_OTHER): Payer: BC Managed Care – PPO | Admitting: Family Medicine

## 2019-07-22 ENCOUNTER — Other Ambulatory Visit: Payer: Self-pay

## 2019-07-22 DIAGNOSIS — H1033 Unspecified acute conjunctivitis, bilateral: Secondary | ICD-10-CM | POA: Diagnosis not present

## 2019-07-22 MED ORDER — POLYMYXIN B-TRIMETHOPRIM 10000-0.1 UNIT/ML-% OP SOLN: 1.0000 [drp] | Freq: Four times a day (QID) | OPHTHALMIC | 0 refills | Status: DC

## 2019-07-22 NOTE — Patient Instructions (Signed)
Use the poly trim drops as directed Keep face/hands clean Discard current eye makeup and do not start new until your symptoms resolve Wash hands and linens frequently as this can be contagious   Please call if symptoms worsen in any way  (especially if any swelling or vision change)  Update if not starting to improve in a week or if worsening

## 2019-07-22 NOTE — Assessment & Plan Note (Signed)
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

## 2019-07-22 NOTE — Progress Notes (Signed)
Virtual Visit via Video Note  I connected with Megan Hayes on 07/22/19 at  9:00 AM EST by a video enabled telemedicine application and verified that I am speaking with the correct person using two identifiers.  Location: Patient: home Provider: office   Parties involved in encounter  Patient: Megan Hayes Provider :   Roxy Manns MD    I discussed the limitations of evaluation and management by telemedicine and the availability of in person appointments. The patient expressed understanding and agreed to proceed.  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 Acute conjunctivitis of both eyes 07/22/2019  . Low serum vitamin B12 02/04/2019  . Angioneurotic edema 12/31/2018  . Glossitis 11/07/2018  . Vitamin D deficiency 03/21/2018  . Health maintenance examination 01/16/2018  . ETD (eustachian tube dysfunction) 01/16/2018  . IBS (irritable bowel syndrome) 03/07/2017  . Epigastric abdominal pain 03/07/2017  . GERD (gastroesophageal reflux disease) 02/11/2017  . Lower back pain 12/12/2016  . Seasonal allergic rhinitis 11/15/2016  . Lactose intolerance in adult   . Abnormal Pap smear of cervix   . Hyperlipidemia    Past Medical History:  Diagnosis Date  . Abnormal Pap smear of  cervix    s/p LEEP then hysterectomy  . Endometriosis   . Hyperlipidemia   . Lactose intolerance in adult    Past Surgical History:  Procedure Laterality Date  . ABDOMINAL HYSTERECTOMY  2004   abnormal pap, ovaries remain [facility])  . COLONOSCOPY WITH PROPOFOL N/A 01/18/2016   WNL per patient (Seeplaputhur Wynona Luna, MD)  . REDUCTION MAMMAPLASTY Bilateral 2008  . REFRACTIVE SURGERY  1994  . TENOSYNOVECTOMY Right 08/18/2016   Procedure: TENOSYNOVECTOMY;  Surgeon: Bradly Bienenstock, MD;  Location: [facility] OR;  Service: Orthopedics;  Laterality: Right;  . TENOSYNOVECTOMY Left 05/2016   Melvyn Novas   Social History   Tobacco Use  . Smoking status: Never Smoker  . Smokeless tobacco: Never Used  Substance Use Topics  . Alcohol use: Yes    Alcohol/week: 1.0 - 2.0 standard drinks    Types: 1 - 2 Standard drinks or equivalent per week    Comment: occasional  . Drug use: No   Family History  Problem Relation Age of Onset  . Cancer Maternal Aunt 65       colon  . Cancer Maternal Uncle        liver  . Cancer Maternal Grandmother        uterine  . Cancer Maternal Grandfather        liver  . CAD Paternal Grandmother   . Cancer Maternal  Uncle        throat  . CAD Paternal Uncle   . Diabetes Neg Hx    Allergies  Allergen Reactions  . Amoxicillin Diarrhea   Current Outpatient Medications on File Prior to Visit  Medication Sig Dispense Refill  . Albuterol Sulfate (PROAIR RESPICLICK) 106 (90 Base) MCG/ACT AEPB Inhale into the lungs every 6 (six) hours as needed for wheezing. 1-2 puffs every 4-6 hours as needed for cough/wheezing    . Alpha-Lipoic Acid 200 MG CAPS Take 1 capsule by mouth 3 (three) times daily.    . Cholecalciferol (VITAMIN D) 50 MCG (2000 UT) CAPS Take 1 capsule (2,000 Units total) by mouth daily. 30 capsule   . Cholecalciferol (VITAMIN D3) 1.25 MG (50000 UT) TABS Take 1 tablet by mouth once a week. 12 tablet 0  . Cyanocobalamin (B-12) 1000 MCG SUBL Place 1 tablet under the  tongue daily. 30 each   . fluticasone (FLONASE) 50 MCG/ACT nasal spray SPRAY 2 SPRAYS INTO EACH NOSTRIL EVERY DAY 48 mL 2  . Lactobacillus (PROBIOTIC ACIDOPHILUS PO) Take 1 capsule by mouth daily.    Marland Kitchen levocetirizine (XYZAL) 5 MG tablet Take 1 tablet by mouth at bedtime.    . metroNIDAZOLE (FLAGYL) 500 MG tablet Take 1 tablet (500 mg total) by mouth 2 (two) times daily. 14 tablet 0  . Multiple Vitamins-Minerals (MULTIVITAMIN ADULT PO) Take by mouth daily.    . famotidine (PEPCID) 40 MG tablet Take 1 tablet by mouth at bedtime.     No current facility-administered medications on file prior to visit.    Review of Systems  Constitutional: Negative for chills, fever and malaise/fatigue.  HENT: Negative for congestion, ear pain, sinus pain and sore throat.        Mild rhinorrhea   Eyes: Positive for discharge and redness. Negative for blurred vision.  Respiratory: Negative for cough, shortness of breath and stridor.   Cardiovascular: Negative for chest pain, palpitations and leg swelling.  Gastrointestinal: Negative for abdominal pain, diarrhea, nausea and vomiting.  Musculoskeletal: Negative for myalgias.  Skin: Negative for rash.  Neurological: Negative for dizziness and headaches.    Observations/Objective: Patient appears well, in no distress Weight is baseline  No facial swelling or asymmetry Pt is squinting with watery eyes  Some mild conjunctival injection -worse on R   (seen on camera) without obvious external eye swelling  Normal voice-not hoarse and no slurred speech No obvious tremor or mobility impairment Moving neck and UEs normally Able to hear the call well  No cough or shortness of breath during interview  Talkative and mentally sharp with no cognitive changes No skin changes on face or neck , no rash or pallor Affect is normal    Assessment and Plan: Problem List Items Addressed This Visit      Other   Acute conjunctivitis of both eyes    Suspect viral or  bacterial  Px polytrim opth susp to use every 6 hours while awake  Cold compresses  Wipe d/c away with clean cloth  Discard contaminated eye makeup  Wash hands and linens frequently  inst to call if any worse (inc in pain or redness or any vision change or swelling)  Update if not starting to improve in a week or if worsening   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 The patient was provided an opportunity to ask questions and all were answered. The patient agreed with the plan and demonstrated an understanding of the instructions.   The patient was advised to call back or seek an in-person evaluation if the symptoms worsen or if the condition fails to improve as anticipated.     Roxy MannsMarne Tower, MD

## 2019-07-25 LAB — FECAL OCCULT BLOOD, IMMUNOCHEMICAL: Fecal Occult Bld: NEGATIVE

## 2019-07-30 ENCOUNTER — Other Ambulatory Visit: Payer: Self-pay | Admitting: Family Medicine

## 2019-08-17 ENCOUNTER — Other Ambulatory Visit: Payer: Self-pay

## 2019-08-17 ENCOUNTER — Encounter: Payer: Self-pay | Admitting: Family Medicine

## 2019-08-17 ENCOUNTER — Ambulatory Visit (INDEPENDENT_AMBULATORY_CARE_PROVIDER_SITE_OTHER): Payer: BC Managed Care – PPO | Admitting: Family Medicine

## 2019-08-17 VITALS — BP 130/78 | HR 76 | Temp 97.6°F | Ht 64.0 in | Wt 168.2 lb

## 2019-08-17 DIAGNOSIS — K140 Glossitis: Secondary | ICD-10-CM

## 2019-08-17 DIAGNOSIS — M79671 Pain in right foot: Secondary | ICD-10-CM

## 2019-08-17 DIAGNOSIS — M79672 Pain in left foot: Secondary | ICD-10-CM | POA: Diagnosis not present

## 2019-08-17 MED ORDER — DICLOFENAC SODIUM 75 MG PO TBEC: 75.0000 mg | DELAYED_RELEASE_TABLET | Freq: Two times a day (BID) | ORAL | 0 refills | Status: DC

## 2019-08-17 MED ORDER — DICLOFENAC SODIUM 1 % EX GEL: 2.0000 g | Freq: Three times a day (TID) | CUTANEOUS | 1 refills | Status: AC

## 2019-08-17 NOTE — Assessment & Plan Note (Addendum)
Ongoing tongue swelling. Has had reassuring ENT, allergist evaluation. Will check for autoimmune condition with AMA, anti-SSA,SSB with new hand/foot swelling. Pt attributes swelling to food intolerances.

## 2019-08-17 NOTE — Assessment & Plan Note (Addendum)
Exam most consistent with R posterior tibial tendonitis, r/o 2nd MT stress fx. Treat with voltaren tablets x 1 wk then PRN + voltaren gel. Also provided with handout and stretching exercises. Check urate.  Update if not improving to consider foot xray. Will also write out of work for 2 days to focus on foot rest, leg elevation to speed recovery.

## 2019-08-17 NOTE — Patient Instructions (Addendum)
Creo que tiene tendonitis de pie derecho - tome diclofenac tabletas y diclofenac crema (sin receta)  Para glossitis - revisaremos laboratorios.   Tendinitis tibial posterior Posterior Tibial Tendinitis La tendinitis tibial posterior es la irritacin de un tendn llamado tendn tibial posterior. El tendn tibial posterior es un tejido similar a un cordn que [employer] de la parte inferior de la pierna y del pie a un msculo que:  Brinda apoyo al arco.  Lunette Stands a pararse en puntas de pie.  Lo ayuda a [employer] hacia abajo y [place]. Esta afeccin causa dolor en el pie y en el tobillo. Tambin puede dar origen a un pie plano. Cules son las causas? La mayora de las veces, esta afeccin es causada por la sobrecarga repetitiva en el tendn (lesin por uso excesivo). Tambin puede ser causada por una lesin sbita que sobrecargue el tendn, como caer [employer] despus de un salto o una cada. Qu incrementa el riesgo? Es ms probable que esta afeccin se manifieste en:  Personas que practican deportes que implican ejercer mucha presin en los pies, tales como: ? Bsquetbol. ? Tenis. ? Ftbol. ? Hockey.  Corredores.  Mujeres [employer] de 40aos de edad y con sobrepeso.  Personas con diabetes.  Personas con aumento de la inestabilidad en el pie.  Personas con pie plano. Cules son los signos o los sntomas? Algunos de los sntomas son los siguientes:  Dolor en la parte interna del tobillo.  Dolor en el arco del pie.  Dolor que empeora al correr, caminar o estar parado.  Hinchazn en la parte interna del tobillo y el pie.  Debilidad en el tobillo o el pie.  Inestabilidad al pararse en puntas de pie.  Aplanamiento del arco del pie. Cmo se diagnostica? Esta afeccin se puede diagnosticar en funcin de lo siguiente:  Sus sntomas.  Sus antecedentes mdicos.  Examen fsico.  Pruebas, como, por ejemplo: ? Radiografa. ? Resonancia magntica  (RM). ? Ecografa. Cmo se trata? El tratamiento para esta afeccin puede incluir lo siguiente:  Contractor hielo sobre la zona lesionada.  Tomar antiinflamatorios no esteroideos (AINE), como ibuprofeno, para reducir Chief Technology Officer y la hinchazn.  Usar calzado especial o plantillas (ortopdicas) para brindar apoyo al arco.  Fisioterapia.  Sustituir los ejercicios de alto impacto con ejercicios de bajo impacto, como natacin o ciclismo. Si los sntomas no mejoran con estos tratamientos, es posible que deba usar una frula, una bota desmontable para caminar o un yeso corto en la pierna durante 6 a 8semanas para evitar que el pie y el tobillo se muevan (inmovilizados). Siga estas instrucciones en su casa: Si tiene un yeso, una frula o una bota:  Mantngalos limpios y secos.  Controle [employer] piel a su alrededor. Informe al mdico acerca de cualquier inquietud. Si tiene un yeso:  No introduzca nada en el interior para rascarse la piel. Esto puede aumentar el riesgo de contraer una infeccin.  Puede aplicar una locin en la piel seca alrededor de los bordes del yeso. No aplique locin en la piel por debajo del yeso. Si tiene una frula o una bota:  selas como se lo haya indicado el mdico. Quteselas solamente como se lo haya indicado el mdico.  Afljelas si siente hormigueo en los dedos de los pies, si se le entumecen, o se le enfran y se tornan de Research officer, trade union. Baos  No tome baos de inmersin, no nade ni use el [employer]  mdico lo autorice. Pregntele al mdico si puede ducharse.  Si el yeso, la frula o la bota no son impermeables: ? No deje que se mojen. ? Cbralos con un envoltorio hermtico mientras tome un bao de inmersin o una ducha. Control del dolor y la hinchazn   Si se lo indican, aplique hielo sobre la zona de la lesin. ? Si tiene una frula o bota desmontable, quteselas como se lo haya indicado el mdico. ? Ponga el hielo en una bolsa  plstica. ? Coloque una [employer] piel y la bolsa de hielo o el yeso y la bolsa de hielo. ? Coloque el hielo durante 15minutos, 2 a 3veces por da.  Mueva los dedos del pie con frecuencia para reducir la rigidez y la hinchazn.  Cuando est sentado o acostado, levante (eleve) la zona lesionada por encima del nivel del corazn. Actividad  No apoye el peso del cuerpo sobre el pie lesionado hasta que lo autorice el mdico. Use las [employer] se lo haya indicado el mdico.  No realice actividades que intensifiquen el dolor o la hinchazn.  Pregntele al mdico cundo puede volver a conducir si tiene un yeso, una frula o una bota en el pie.  Retome sus actividades normales segn lo indicado por el mdico. Pregntele al mdico qu actividades son seguras para usted.  Haga ejercicio como se lo haya indicado el mdico. Instrucciones generales  Use los medicamentos de venta libre y los recetados solamente como se lo haya indicado el mdico.  Si tiene un dispositivo ortopdico, selo segn las indicaciones del mdico.  Consulting civil engineer a todas las visitas de seguimiento como se lo haya indicado el mdico. Esto es importante. Cmo se evita?  Use calzado que sea adecuado para su actividad deportiva.  Evite realizar [employer] deportivas que le causen dolor o hinchazn en el tobillo o el pie.  Haga ejercicios de amplitud de movimientos y de estiramiento antes de la [place].  Deje de entrenar si siente dolor o tiene hinchazn.  Consulte con el mdico si el dolor o la hinchazn no mejoran despus de un par [employer] de reposo.  Si comienza una nueva actividad deportiva, empiece a trabajar la fuerza y la flexibilidad poco a poco. Comunquese con un mdico si:  Sus sntomas empeoran.  Los sntomas no mejoran en 6 a 8semanas.  Tiene sntomas nuevos e inexplicables.  La frula, la bota o el yeso se daan. Resumen  La tendinitis tibial posterior es la irritacin de un tendn  llamado tendn tibial posterior.  [place] veces, esta afeccin es causada por la sobrecarga repetitiva en el tendn (lesin por uso excesivo).  Esta afeccin causa dolor en el pie y dolor en el tobillo. Tambin puede dar origen a un pie plano.  Esta afeccin puede tratarse no realizando actividades de alto impacto, aplicando hielo, realizando fisioterapia, usando dispositivos ortopdicos, y [place] un yeso, una frula o una bota si es necesario. Esta informacin no tiene Marine scientist el consejo del mdico. Asegrese de hacerle al mdico cualquier pregunta que tenga. Document Revised: 11/10/2018 Document Reviewed: 11/10/2018 Elsevier Patient Education  [place].

## 2019-08-17 NOTE — Progress Notes (Addendum)
This visit was conducted in person.  BP 130/78   Pulse 76   Temp 97.6 F (36.4 C) (Skin)   Ht 5\' 4"  (1.626 m)   Wt 168 lb 3.2 oz (76.3 kg)   SpO2 99%   BMI 28.87 kg/m    CC: foot pain/swelling Subjective:    Patient ID: Megan Hayes, female    DOB: 06-27-1966, 54 y.o.   MRN: [id number]  HPI: Savvy Peeters is a 54 y.o. female presenting on 08/17/2019 for Follow-up   3 wk h/o bilateral leg pain and foot swelling into legs, especially localized to R 1st MTPJ with pain, swelling, redness. R>L pain. Worse pain at medial ankles. Worse after long day at work. Better with leg elevation. She took advil 400mg  this morning with benefit and apple cider tablet. Some hand swelling with tightness. Denies inciting trauma/injury or falls.   Ongoing glossitis flares with certain foods/drink - wheat, green juices. Today was especially bad, unclear trigger.  Normal dental exam 01/2019.  Changed to fluoride free dental paste without benefit.  Has used alcohol free mouth wash.  Coconut oil with some improvement..  Had reassuring allergist eval - but she finds glossitis flares with dairy products and sweets.   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 Thinks may cause hives.      Relevant past medical, surgical, family and social history reviewed and updated as indicated. Interim medical history since our last visit reviewed. Allergies and medications reviewed and updated. Outpatient Medications Prior to Visit  Medication Sig Dispense Refill  . Albuterol Sulfate (PROAIR RESPICLICK) 536 (90 Base) MCG/ACT AEPB Inhale into the lungs every 6 (six) hours as needed for wheezing. 1-2 puffs every 4-6 hours as needed for cough/wheezing    . Alpha-Lipoic Acid 200 MG CAPS Take 1 capsule by mouth 3 (three) times daily.    . Cholecalciferol (VITAMIN D) 50 MCG (2000 UT) CAPS Take 1 capsule (2,000 Units total) by mouth daily. 30 capsule   . Cholecalciferol (VITAMIN D3)  1.25 MG (50000 UT) TABS Take 1 tablet by mouth once a week. 12 tablet 0  . Cyanocobalamin (B-12) 1000 MCG SUBL Place 1 tablet under the tongue daily. 30 each   . famotidine (PEPCID) 40 MG tablet Take 1 tablet by mouth at bedtime.    . fluticasone (FLONASE) 50 MCG/ACT nasal spray SPRAY 2 SPRAYS INTO EACH NOSTRIL EVERY DAY 48 mL 2  . Lactobacillus (PROBIOTIC ACIDOPHILUS PO) Take 1 capsule by mouth daily.    Marland Kitchen levocetirizine (XYZAL) 5 MG tablet Take 1 tablet by mouth at bedtime.    . Multiple Vitamins-Minerals (MULTIVITAMIN ADULT PO) Take by mouth daily.    . Vitamin D, Ergocalciferol, (DRISDOL) 1.25 MG (50000 UT) CAPS capsule TAKE 1 CAPSULE BY MOUTH ONE TIME PER WEEK 12 capsule 1  . metroNIDAZOLE (FLAGYL) 500 MG tablet Take 1 tablet (500 mg total) by mouth 2 (two) times daily. 14 tablet 0  . trimethoprim-polymyxin b (POLYTRIM) ophthalmic solution Place 1 drop into both eyes every 6 (six) hours. 10 mL 0   No facility-administered medications prior to visit.     Per HPI unless specifically indicated in ROS section below Review of Systems Objective:    BP 130/78   Pulse 76   Temp 97.6 F (36.4 C) (Skin)   Ht 5\' 4"  (1.626 m)   Wt 168 lb 3.2 oz (76.3 kg)   SpO2 99%   BMI 28.87 kg/m  Wt Readings from Last 3 Encounters:  08/17/19 168 lb 3.2 oz (76.3 kg)  07/15/19 170 lb (77.1 kg)  05/13/19 168 lb 2 oz (76.3 kg)    Physical Exam Vitals and nursing note reviewed.  Constitutional:      Appearance: Normal appearance. She is not ill-appearing.  HENT:     Mouth/Throat:     Mouth: Mucous membranes are moist.     Pharynx: Oropharynx is clear. No oropharyngeal exudate or posterior oropharyngeal erythema.     Comments: Tongue swelling present, mild erythema to hard palate  Musculoskeletal:        General: Swelling and tenderness present. No signs of injury.     Right lower leg: No edema.     Left lower leg: No edema.     Comments:  No current swelling of bilateral hands, no active  synovitis, 2+ rad pulses bilaterally.  L foot - mildly tender swelling to medial ankle R foot - markedly tender swelling at medial ankle into midfoot as well as at 2nd MT shaft, limited ROM 2/2 pain with ankle eversion Diminished but palpable DP/PT bilaterally  Skin:    General: Skin is warm and dry.     Findings: Rash (faint papular rash on forearms) present.  Neurological:     Mental Status: She is alert.  Psychiatric:        Mood and Affect: Mood normal.        Behavior: Behavior normal.       Assessment & Plan:  This visit occurred during the SARS-CoV-2 public health emergency.  Safety protocols were in place, including screening questions prior to the visit, additional usage of staff PPE, and extensive cleaning of exam room while observing appropriate contact time as indicated for disinfecting solutions.  Pt planning on searching for new job Problem List Items Addressed This Visit    Glossitis    Ongoing tongue swelling. Has had reassuring ENT, allergist evaluation. Will check for autoimmune condition with AMA, anti-SSA,SSB with new hand/foot swelling. Pt attributes swelling to food intolerances.       Relevant Orders   Sedimentation rate   Sjogren's syndrome antibods(ssa + ssb)   ANA   Foot pain, bilateral - Primary    Exam most consistent with R posterior tibial tendonitis, r/o 2nd MT stress fx. Treat with voltaren tablets x 1 wk then PRN + voltaren gel. Also provided with handout and stretching exercises. Check urate.  Update if not improving to consider foot xray. Will also write out of work for 2 days to focus on foot rest, leg elevation to speed recovery.       Relevant Orders   Uric acid   RF - RA       Meds ordered this encounter  Medications  . diclofenac Sodium (VOLTAREN) 1 % GEL    Sig: Apply 2 g topically 3 (three) times daily.    Dispense:  100 g    Refill:  1  . diclofenac (VOLTAREN) 75 MG EC tablet    Sig: Take 1 tablet (75 mg total) by mouth 2 (two)  times daily. For 1 week then as needed. Take with food    Dispense:  30 tablet    Refill:  0   Orders Placed This Encounter  Procedures  . Uric acid  . Sedimentation rate  . Sjogren's syndrome antibods(ssa + ssb)  . ANA  . RF - RA    Follow up plan: No follow-ups on file.  Eustaquio Boyden, MD

## 2019-08-18 LAB — URIC ACID: Uric Acid, Serum: 2.7 mg/dL (ref 2.4–7.0)

## 2019-08-18 LAB — SEDIMENTATION RATE: Sed Rate: 22 mm/hr (ref 0–30)

## 2019-08-19 LAB — SJOGREN'S SYNDROME ANTIBODS(SSA + SSB)
SSA (Ro) (ENA) Antibody, IgG: <1 AI
SSB (La) (ENA) Antibody, IgG: <1 AI

## 2019-08-19 LAB — ANA: Anti Nuclear Antibody (ANA): NEGATIVE

## 2019-08-19 LAB — RHEUMATOID FACTOR: Rheumatoid fact SerPl-aCnc: <14 IU/mL (ref <14)

## 2019-09-09 ENCOUNTER — Other Ambulatory Visit: Payer: Self-pay | Admitting: Family Medicine

## 2019-09-09 NOTE — Telephone Encounter (Signed)
Diclofenac tab Last filled:  08/17/19, #30 Last OV:  08/17/19, acute foot pain Next OV:  none
